# Patient Record
Sex: Male | Born: 1965 | Race: Black or African American | Hispanic: No | Marital: Married | Smoking: Never smoker
Health system: Southern US, Community
[De-identification: ages and names within clinical notes are randomized; demographics above are authoritative.]

## PROBLEM LIST (undated history)

## (undated) DIAGNOSIS — K219 Gastro-esophageal reflux disease without esophagitis: Secondary | ICD-10-CM

## (undated) DIAGNOSIS — C801 Malignant (primary) neoplasm, unspecified: Secondary | ICD-10-CM

## (undated) DIAGNOSIS — E119 Type 2 diabetes mellitus without complications: Secondary | ICD-10-CM

## (undated) DIAGNOSIS — I10 Essential (primary) hypertension: Secondary | ICD-10-CM

---

## 2013-12-06 HISTORY — PX: VASECTOMY: SHX75

## 2017-01-05 ENCOUNTER — Other Ambulatory Visit: Payer: Self-pay | Admitting: Urology

## 2017-01-05 DIAGNOSIS — C61 Malignant neoplasm of prostate: Secondary | ICD-10-CM

## 2017-01-24 ENCOUNTER — Ambulatory Visit
Admission: RE | Admit: 2017-01-24 | Discharge: 2017-01-24 | Disposition: A | Discharge status: Home / Self Care | Payer: BLUE CROSS/BLUE SHIELD | Source: Ambulatory Visit | Attending: Urology | Admitting: Urology

## 2017-01-24 DIAGNOSIS — C61 Malignant neoplasm of prostate: Secondary | ICD-10-CM

## 2017-01-24 MED ORDER — GADOBENATE DIMEGLUMINE 529 MG/ML IV SOLN
20.0000 mL | Freq: Once | INTRAVENOUS | Status: AC | PRN
  Administered 2017-01-24: 20 mL via INTRAVENOUS

## 2017-09-28 ENCOUNTER — Other Ambulatory Visit: Payer: Self-pay | Admitting: Urology

## 2017-10-20 NOTE — Patient Instructions (Addendum)
Ryan Newton  10/20/2017   Your procedure is scheduled on: 10-24-17   Report to [facility] Main  Entrance Take [place] Elevators to 3rd floor to  [place] at 5:15 AM.   Call this number if you have problems the morning of surgery [phone]    Remember: ONLY 1 PERSON MAY GO WITH YOU TO SHORT STAY TO GET  READY MORNING OF [name].  Do not eat food or drink liquids :After Midnight.     Take these medicines the morning of surgery with A SIP OF WATER: Carvedilol (Coreg), and Loratadine (Claritin) DO NOT TAKE ANY DIABETIC MEDICATIONS DAY OF YOUR SURGERY                               You may not have any metal on your body including hair pins and              piercings  Do not wear jewelry, lotions, powders, or deodorant             Men may shave face and neck.   Do not bring valuables to the hospital. [name].  Contacts, dentures or bridgework may not be worn into surgery.  Leave suitcase in the car. After surgery it may be brought to your room.                Please read over the following fact sheets you were given: _____________________________________________________________________ How to Manage Your Diabetes Before and After Surgery  Why is it important to control my blood sugar before and after surgery? . Improving blood sugar levels before and after surgery helps healing and can limit problems. . A way of improving blood sugar control is eating a healthy diet by: o  Eating less sugar and carbohydrates o  Increasing activity/exercise o  Talking with your doctor about reaching your blood sugar goals . High blood sugars (greater than 180 mg/dL) can raise your risk of infections and slow your recovery, so you will need to focus on controlling your diabetes during the weeks before surgery. . Make sure that the doctor who takes care of your diabetes knows about your planned surgery including  the date and location.  How do I manage my blood sugar before surgery? . Check your blood sugar at least 4 times a day, starting 2 days before surgery, to make sure that the level is not too high or low. o Check your blood sugar the morning of your surgery when you wake up and every 2 hours until you get to the Short Stay unit. . If your blood sugar is less than 70 mg/dL, you will need to treat for low blood sugar: o Do not take insulin. o Treat a low blood sugar (less than 70 mg/dL) with  cup of clear juice (cranberry or apple), 4 glucose tablets, OR glucose gel. o Recheck blood sugar in 15 minutes after treatment (to make sure it is greater than 70 mg/dL). If your blood sugar is not greater than 70 mg/dL on recheck, call [phone] for further instructions. . Report your blood sugar to the short stay nurse when you get to Short Stay.  . If you are admitted to the hospital after surgery: o  Your blood sugar will be checked by the staff and you will probably be given insulin after surgery (instead of oral diabetes medicines) to make sure you have good blood sugar levels. o The goal for blood sugar control after surgery is 80-180 mg/dL.   WHAT DO I DO ABOUT MY DIABETES MEDICATION?  Marland Kitchen Do not take oral diabetes medicines (pills) the morning of surgery.  . THE DAY BEFORE SURGERY, take your usual morning dose of Glimepiride (Amaryl), your usual  Pioglitazone-Metformin (Actosplus Met), and your usual 30 units of  Levemir  insulin.        Patient Signature:  Date:   Nurse Signature:  Date:   Reviewed and Endorsed by [facility] Patient Education Committee, August 2015            [facility] - Preparing for Surgery Before surgery, you can play an important role.  Because skin is not sterile, your skin needs to be as free of germs as possible.  You can reduce the number of germs on your skin by washing with CHG (chlorahexidine gluconate) soap before surgery.  CHG is an antiseptic cleaner  which kills germs and bonds with the skin to continue killing germs even after washing. Please DO NOT use if you have an allergy to CHG or antibacterial soaps.  If your skin becomes reddened/irritated stop using the CHG and inform your nurse when you arrive at Short Stay. Do not shave (including legs and underarms) for at least 48 hours prior to the first CHG shower.  You may shave your face/neck. Please follow these instructions carefully:  1.  Shower with CHG Soap the night before surgery and the  morning of Surgery.  2.  If you choose to wash your hair, wash your hair first as usual with your  normal  shampoo.  3.  After you shampoo, rinse your hair and body thoroughly to remove the  shampoo.                           4.  Use CHG as you would any other liquid soap.  You can apply chg directly  to the skin and wash                       Gently with a scrungie or clean washcloth.  5.  Apply the CHG Soap to your body ONLY FROM THE NECK DOWN.   Do not use on face/ open                           Wound or open sores. Avoid contact with eyes, ears mouth and genitals (private parts).                       Wash face,  Genitals (private parts) with your normal soap.             6.  Wash thoroughly, paying special attention to the area where your surgery  will be performed.  7.  Thoroughly rinse your body with warm water from the neck down.  8.  DO NOT shower/wash with your normal soap after using and rinsing off  the CHG Soap.                9.  Pat yourself dry with a clean towel.  10.  Wear clean pajamas.            11.  Place clean sheets on your bed the night of your first shower and do not  sleep with pets. Day of Surgery : Do not apply any lotions/deodorants the morning of surgery.  Please wear clean clothes to the hospital/surgery center.  FAILURE TO FOLLOW THESE INSTRUCTIONS MAY RESULT IN THE CANCELLATION OF YOUR SURGERY PATIENT SIGNATURE_________________________________  NURSE  SIGNATURE__________________________________  ________________________________________________________________________   Adam Phenix  An incentive spirometer is a tool that can help keep your lungs clear and active. This tool measures how well you are filling your lungs with each breath. Taking long deep breaths may help reverse or decrease the chance of developing breathing (pulmonary) problems (especially infection) following:  A long period of time when you are unable to move or be active. BEFORE THE PROCEDURE   If the spirometer includes an indicator to show your best effort, your nurse or respiratory therapist will set it to a desired goal.  If possible, sit up straight or lean slightly forward. Try not to slouch.  Hold the incentive spirometer in an upright position. INSTRUCTIONS FOR USE  1. Sit on the edge of your bed if possible, or sit up as far as you can in bed or on a chair. 2. Hold the incentive spirometer in an upright position. 3. Breathe out normally. 4. Place the mouthpiece in your mouth and seal your lips tightly around it. 5. Breathe in slowly and as deeply as possible, raising the piston or the ball toward the top of the column. 6. Hold your breath for 3-5 seconds or for as long as possible. Allow the piston or ball to fall to the bottom of the column. 7. Remove the mouthpiece from your mouth and breathe out normally. 8. Rest for a few seconds and repeat Steps 1 through 7 at least 10 times every 1-2 hours when you are awake. Take your time and take a few normal breaths between deep breaths. 9. The spirometer may include an indicator to show your best effort. Use the indicator as a goal to work toward during each repetition. 10. After each set of 10 deep breaths, practice coughing to be sure your lungs are clear. If you have an incision (the cut made at the time of surgery), support your incision when coughing by placing a pillow or rolled up towels firmly  against it. Once you are able to get out of bed, walk around indoors and cough well. You may stop using the incentive spirometer when instructed by your caregiver.  RISKS AND COMPLICATIONS  Take your time so you do not get dizzy or light-headed.  If you are in pain, you may need to take or ask for pain medication before doing incentive spirometry. It is harder to take a deep breath if you are having pain. AFTER USE  Rest and breathe slowly and easily.  It can be helpful to keep track of a log of your progress. Your caregiver can provide you with a simple table to help with this. If you are using the spirometer at home, follow these instructions: [place] IF:   You are having difficultly using the spirometer.  You have trouble using the spirometer as often as instructed.  Your pain medication is not giving enough relief while using the spirometer.  You develop fever of 100.5 F (38.1 C) or higher. SEEK IMMEDIATE MEDICAL CARE IF:   You cough up bloody  sputum that had not been present before.  You develop fever of 102 F (38.9 C) or greater.  You develop worsening pain at or near the incision site. MAKE SURE YOU:   Understand these instructions.  Will watch your condition.  Will get help right away if you are not doing well or get worse. Document Released: 04/04/2007 Document Revised: 02/14/2012 Document Reviewed: 06/05/2007 Exit[employer] Patient Information 2014 Exit[employer], [place].   ________________________________________________________________________  WHAT IS A BLOOD TRANSFUSION? Blood Transfusion Information  A transfusion is the replacement of blood or some of its parts. Blood is made up of multiple cells which provide different functions.  Red blood cells carry oxygen and are used for blood loss replacement.  White blood cells fight against infection.  Platelets control bleeding.  Plasma helps clot blood.  Other blood products are available for  specialized needs, such as hemophilia or other clotting disorders. BEFORE THE TRANSFUSION  Who gives blood for transfusions?   Healthy volunteers who are fully evaluated to make sure their blood is safe. This is blood bank blood. Transfusion therapy is the safest it has ever been in the practice of medicine. Before blood is taken from a donor, a complete history is taken to make sure that person has no history of diseases nor engages in risky social behavior (examples are intravenous drug use or sexual activity with multiple partners). The donor's travel history is screened to minimize risk of transmitting infections, such as malaria. The donated blood is tested for signs of infectious diseases, such as HIV and hepatitis. The blood is then tested to be sure it is compatible with you in order to minimize the chance of a transfusion reaction. If you or a relative donates blood, this is often done in anticipation of surgery and is not appropriate for emergency situations. It takes many days to process the donated blood. RISKS AND COMPLICATIONS Although transfusion therapy is very safe and saves many lives, the main dangers of transfusion include:   Getting an infectious disease.  Developing a transfusion reaction. This is an allergic reaction to something in the blood you were given. Every precaution is taken to prevent this. The decision to have a blood transfusion has been considered carefully by your caregiver before blood is given. Blood is not given unless the benefits outweigh the risks. AFTER THE TRANSFUSION  Right after receiving a blood transfusion, you will usually feel much better and more energetic. This is especially true if your red blood cells have gotten low (anemic). The transfusion raises the level of the red blood cells which carry oxygen, and this usually causes an energy increase.  The nurse administering the transfusion will monitor you carefully for complications. HOME CARE  INSTRUCTIONS  No special instructions are needed after a transfusion. You may find your energy is better. Speak with your caregiver about any limitations on activity for underlying diseases you may have. SEEK MEDICAL CARE IF:   Your condition is not improving after your transfusion.  You develop redness or irritation at the intravenous (IV) site. SEEK IMMEDIATE MEDICAL CARE IF:  Any of the following symptoms occur over the next 12 hours:  Shaking chills.  You have a temperature by mouth above 102 F (38.9 C), not controlled by medicine.  Chest, back, or muscle pain.  People around you feel you are not acting correctly or are confused.  Shortness of breath or difficulty breathing.  Dizziness and fainting.  You get a rash or develop hives.  You have a  decrease in urine output.  Your urine turns a dark color or changes to pink, red, or brown. Any of the following symptoms occur over the next 10 days:  You have a temperature by mouth above 102 F (38.9 C), not controlled by medicine.  Shortness of breath.  Weakness after normal activity.  The white part of the eye turns yellow (jaundice).  You have a decrease in the amount of urine or are urinating less often.  Your urine turns a dark color or changes to pink, red, or brown. Document Released: 11/19/2000 Document Revised: 02/14/2012 Document Reviewed: 07/08/2008 [facility] Patient Information 2014 [place], [place].  _______________________________________________________________________

## 2017-10-21 ENCOUNTER — Encounter (HOSPITAL_COMMUNITY)
Admission: RE | Admit: 2017-10-21 | Discharge: 2017-10-21 | Disposition: A | Discharge status: Home / Self Care | Payer: PRIVATE HEALTH INSURANCE | Source: Ambulatory Visit | Attending: Urology | Admitting: Urology

## 2017-10-21 ENCOUNTER — Other Ambulatory Visit: Payer: Self-pay

## 2017-10-21 ENCOUNTER — Encounter (HOSPITAL_COMMUNITY): Payer: Self-pay

## 2017-10-21 DIAGNOSIS — I10 Essential (primary) hypertension: Secondary | ICD-10-CM | POA: Diagnosis not present

## 2017-10-21 DIAGNOSIS — E7800 Pure hypercholesterolemia, unspecified: Secondary | ICD-10-CM | POA: Diagnosis not present

## 2017-10-21 DIAGNOSIS — C61 Malignant neoplasm of prostate: Secondary | ICD-10-CM | POA: Diagnosis not present

## 2017-10-21 DIAGNOSIS — Z880 Allergy status to penicillin: Secondary | ICD-10-CM | POA: Diagnosis not present

## 2017-10-21 DIAGNOSIS — Z794 Long term (current) use of insulin: Secondary | ICD-10-CM | POA: Diagnosis not present

## 2017-10-21 DIAGNOSIS — E119 Type 2 diabetes mellitus without complications: Secondary | ICD-10-CM | POA: Diagnosis not present

## 2017-10-21 DIAGNOSIS — Z79899 Other long term (current) drug therapy: Secondary | ICD-10-CM | POA: Diagnosis not present

## 2017-10-21 HISTORY — DX: Gastro-esophageal reflux disease without esophagitis: K21.9

## 2017-10-21 HISTORY — DX: Essential (primary) hypertension: I10

## 2017-10-21 HISTORY — DX: Malignant (primary) neoplasm, unspecified: C80.1

## 2017-10-21 HISTORY — DX: Type 2 diabetes mellitus without complications: E11.9

## 2017-10-21 LAB — HEMOGLOBIN A1C
HEMOGLOBIN A1C: 10.7 % — AB (ref 4.8–5.6)
Mean Plasma Glucose: 260.39 mg/dL

## 2017-10-21 LAB — CBC
HCT: 41 % (ref 39.0–52.0)
HEMOGLOBIN: 13.8 g/dL (ref 13.0–17.0)
MCH: 26.2 pg (ref 26.0–34.0)
MCHC: 33.7 g/dL (ref 30.0–36.0)
MCV: 77.8 fL — ABNORMAL LOW (ref 78.0–100.0)
Platelets: 233 10*3/uL (ref 150–400)
RBC: 5.27 MIL/uL (ref 4.22–5.81)
RDW: 13.6 % (ref 11.5–15.5)
WBC: 4.5 10*3/uL (ref 4.0–10.5)

## 2017-10-21 LAB — BASIC METABOLIC PANEL
ANION GAP: 9 (ref 5–15)
BUN: 16 mg/dL (ref 6–20)
CALCIUM: 9.1 mg/dL (ref 8.9–10.3)
CHLORIDE: 101 mmol/L (ref 101–111)
CO2: 27 mmol/L (ref 22–32)
Creatinine, Ser: 1.1 mg/dL (ref 0.61–1.24)
GFR calc non Af Amer: >60 mL/min (ref >60)
Glucose, Bld: 307 mg/dL — ABNORMAL HIGH (ref 65–99)
Potassium: 4.5 mmol/L (ref 3.5–5.1)
SODIUM: 137 mmol/L (ref 135–145)

## 2017-10-21 LAB — GLUCOSE, CAPILLARY: Glucose-Capillary: 330 mg/dL — ABNORMAL HIGH (ref 65–99)

## 2017-10-21 LAB — ABO/RH: ABO/RH(D): A POS

## 2017-10-21 NOTE — Progress Notes (Addendum)
10-21-17 HGA1C result routed to Dr. Alinda Money. Also called Beola Cord at [facility] regarding pt's HGA1C and blood glucose results. She will forward to Dr. Alinda Money for review.

## 2017-10-21 NOTE — Progress Notes (Signed)
   10/21/17 0838  OBSTRUCTIVE SLEEP APNEA  Have you ever been diagnosed with sleep apnea through a sleep study? No  Do you snore loudly (loud enough to be heard through closed doors)?  1  Do you often feel tired, fatigued, or sleepy during the daytime (such as falling asleep during driving or talking to someone)? 0  Has anyone observed you stop breathing during your sleep? 0  Do you have, or are you being treated for high blood pressure? 1  BMI more than 35 kg/m2? 1  Age > 50 (1-yes) 1  Neck circumference greater than:Male 16 inches or larger, Male 17inches or larger? 0  Male Gender (Yes=1) 1  Obstructive Sleep Apnea Score 5

## 2017-10-23 NOTE — Anesthesia Preprocedure Evaluation (Addendum)
Anesthesia Evaluation  Patient identified by MRN, date of birth, ID band Patient awake    Reviewed: Allergy & Precautions, H&P , Patient's Chart, lab work & pertinent test results, reviewed documented beta blocker date and time   Airway Mallampati: II  TM Distance: >3 FB Neck ROM: full    Dental no notable dental hx.    Pulmonary    Pulmonary exam normal breath sounds clear to auscultation       Cardiovascular hypertension,  Rhythm:regular Rate:Normal     Neuro/Psych    GI/Hepatic   Endo/Other  diabetes  Renal/GU      Musculoskeletal   Abdominal   Peds  Hematology   Anesthesia Other Findings   Reproductive/Obstetrics                             Anesthesia Physical Anesthesia Plan  ASA: III  Anesthesia Plan: General   Post-op Pain Management:    Induction: Intravenous  PONV Risk Score and Plan: 2 and Treatment may vary due to age or medical condition, Ondansetron, Scopolamine patch - Pre-op and Dexamethasone  Airway Management Planned: Oral ETT  Additional Equipment:   Intra-op Plan:   Post-operative Plan: Extubation in OR  Informed Consent: I have reviewed the patients History and Physical, chart, labs and discussed the procedure including the risks, benefits and alternatives for the proposed anesthesia with the patient or authorized representative who has indicated his/her understanding and acceptance.   Dental Advisory Given  Plan Discussed with: CRNA and Surgeon  Anesthesia Plan Comments: ( Am BS; May need glucomander )       Anesthesia Quick Evaluation

## 2017-10-24 ENCOUNTER — Other Ambulatory Visit: Payer: Self-pay

## 2017-10-24 ENCOUNTER — Ambulatory Visit (HOSPITAL_COMMUNITY): Payer: PRIVATE HEALTH INSURANCE | Admitting: Anesthesiology

## 2017-10-24 ENCOUNTER — Encounter (HOSPITAL_COMMUNITY)
Admission: RE | Disposition: A | Discharge status: Home / Self Care | Payer: Self-pay | Source: Ambulatory Visit | Attending: Urology

## 2017-10-24 ENCOUNTER — Observation Stay (HOSPITAL_COMMUNITY)
Admission: RE | Admit: 2017-10-24 | Discharge: 2017-10-25 | Disposition: A | Discharge status: Home / Self Care | Payer: PRIVATE HEALTH INSURANCE | Source: Ambulatory Visit | Attending: Urology | Admitting: Urology

## 2017-10-24 ENCOUNTER — Encounter (HOSPITAL_COMMUNITY): Payer: Self-pay | Admitting: *Deleted

## 2017-10-24 DIAGNOSIS — Z880 Allergy status to penicillin: Secondary | ICD-10-CM | POA: Insufficient documentation

## 2017-10-24 DIAGNOSIS — I10 Essential (primary) hypertension: Secondary | ICD-10-CM | POA: Insufficient documentation

## 2017-10-24 DIAGNOSIS — C61 Malignant neoplasm of prostate: Principal | ICD-10-CM | POA: Diagnosis present

## 2017-10-24 DIAGNOSIS — E119 Type 2 diabetes mellitus without complications: Secondary | ICD-10-CM | POA: Insufficient documentation

## 2017-10-24 DIAGNOSIS — Z794 Long term (current) use of insulin: Secondary | ICD-10-CM | POA: Insufficient documentation

## 2017-10-24 DIAGNOSIS — E7800 Pure hypercholesterolemia, unspecified: Secondary | ICD-10-CM | POA: Insufficient documentation

## 2017-10-24 DIAGNOSIS — Z79899 Other long term (current) drug therapy: Secondary | ICD-10-CM | POA: Insufficient documentation

## 2017-10-24 HISTORY — PX: LYMPHADENECTOMY: SHX5960

## 2017-10-24 HISTORY — PX: ROBOT ASSISTED LAPAROSCOPIC RADICAL PROSTATECTOMY: SHX5141

## 2017-10-24 LAB — TYPE AND SCREEN
ABO/RH(D): A POS
Antibody Screen: NEGATIVE

## 2017-10-24 LAB — HEMOGLOBIN AND HEMATOCRIT, BLOOD
HEMATOCRIT: 39 % (ref 39.0–52.0)
HEMOGLOBIN: 12.8 g/dL — AB (ref 13.0–17.0)

## 2017-10-24 LAB — GLUCOSE, CAPILLARY
GLUCOSE-CAPILLARY: 238 mg/dL — AB (ref 65–99)
GLUCOSE-CAPILLARY: 250 mg/dL — AB (ref 65–99)
GLUCOSE-CAPILLARY: 329 mg/dL — AB (ref 65–99)
Glucose-Capillary: 170 mg/dL — ABNORMAL HIGH (ref 65–99)
Glucose-Capillary: 263 mg/dL — ABNORMAL HIGH (ref 65–99)

## 2017-10-24 SURGERY — ROBOTIC ASSISTED LAPAROSCOPIC RADICAL PROSTATECTOMY LEVEL 2
Anesthesia: General

## 2017-10-24 MED ORDER — DIPHENHYDRAMINE HCL 50 MG/ML IJ SOLN: 12.5000 mg | Freq: Four times a day (QID) | INTRAMUSCULAR | Status: DC | PRN

## 2017-10-24 MED ORDER — MORPHINE SULFATE (PF) 4 MG/ML IV SOLN
2.0000 mg | INTRAVENOUS | Status: DC | PRN
  Administered 2017-10-24: 4 mg via INTRAVENOUS
  Administered 2017-10-24: 2 mg via INTRAVENOUS
  Filled 2017-10-24 (×2): qty 1

## 2017-10-24 MED ORDER — PROPOFOL 10 MG/ML IV BOLUS
INTRAVENOUS | Status: DC | PRN
  Administered 2017-10-24: 200 mg via INTRAVENOUS

## 2017-10-24 MED ORDER — LIDOCAINE 2% (20 MG/ML) 5 ML SYRINGE
INTRAMUSCULAR | Status: AC
  Filled 2017-10-24: qty 5

## 2017-10-24 MED ORDER — HEPARIN SODIUM (PORCINE) 1000 UNIT/ML IJ SOLN
INTRAMUSCULAR | Status: AC
  Filled 2017-10-24: qty 1

## 2017-10-24 MED ORDER — SODIUM CHLORIDE 0.9 % IV BOLUS (SEPSIS)
1000.0000 mL | Freq: Once | INTRAVENOUS | Status: AC
  Administered 2017-10-24: 1000 mL via INTRAVENOUS

## 2017-10-24 MED ORDER — SCOPOLAMINE 1 MG/3DAYS TD PT72
MEDICATED_PATCH | TRANSDERMAL | Status: AC
  Filled 2017-10-24: qty 1

## 2017-10-24 MED ORDER — PRAVASTATIN SODIUM 20 MG PO TABS
20.0000 mg | ORAL_TABLET | Freq: Every evening | ORAL | Status: DC
  Administered 2017-10-24: 20 mg via ORAL
  Filled 2017-10-24: qty 1

## 2017-10-24 MED ORDER — DOCUSATE SODIUM 100 MG PO CAPS
100.0000 mg | ORAL_CAPSULE | Freq: Two times a day (BID) | ORAL | Status: DC
  Administered 2017-10-24 – 2017-10-25 (×2): 100 mg via ORAL
  Filled 2017-10-24 (×2): qty 1

## 2017-10-24 MED ORDER — SUGAMMADEX SODIUM 500 MG/5ML IV SOLN
INTRAVENOUS | Status: DC | PRN
  Administered 2017-10-24: 250 mg via INTRAVENOUS

## 2017-10-24 MED ORDER — KETOROLAC TROMETHAMINE 15 MG/ML IJ SOLN
15.0000 mg | Freq: Four times a day (QID) | INTRAMUSCULAR | Status: DC
  Administered 2017-10-24 – 2017-10-25 (×5): 15 mg via INTRAVENOUS
  Filled 2017-10-24 (×4): qty 1

## 2017-10-24 MED ORDER — BUPIVACAINE-EPINEPHRINE (PF) 0.25% -1:200000 IJ SOLN
INTRAMUSCULAR | Status: AC
  Filled 2017-10-24: qty 30

## 2017-10-24 MED ORDER — HYDROMORPHONE HCL 1 MG/ML IJ SOLN
INTRAMUSCULAR | Status: AC
  Administered 2017-10-24: 0.5 mg via INTRAVENOUS
  Filled 2017-10-24: qty 2

## 2017-10-24 MED ORDER — LIDOCAINE HCL (CARDIAC) 20 MG/ML IV SOLN
INTRAVENOUS | Status: DC | PRN
  Administered 2017-10-24: 100 mg via INTRAVENOUS
  Administered 2017-10-24: 50 mg via INTRAVENOUS

## 2017-10-24 MED ORDER — LACTATED RINGERS IV SOLN
INTRAVENOUS | Status: DC | PRN
  Administered 2017-10-24 (×2): via INTRAVENOUS

## 2017-10-24 MED ORDER — PROPOFOL 10 MG/ML IV BOLUS
INTRAVENOUS | Status: AC
  Filled 2017-10-24: qty 20

## 2017-10-24 MED ORDER — ACETAMINOPHEN 325 MG PO TABS: 650.0000 mg | ORAL_TABLET | ORAL | Status: DC | PRN

## 2017-10-24 MED ORDER — SCOPOLAMINE 1 MG/3DAYS TD PT72
MEDICATED_PATCH | TRANSDERMAL | Status: DC | PRN
  Administered 2017-10-24: 1 via TRANSDERMAL

## 2017-10-24 MED ORDER — FLEET ENEMA 7-19 GM/118ML RE ENEM: 1.0000 | ENEMA | Freq: Once | RECTAL | Status: DC

## 2017-10-24 MED ORDER — BUPIVACAINE-EPINEPHRINE 0.25% -1:200000 IJ SOLN
INTRAMUSCULAR | Status: DC | PRN
  Administered 2017-10-24: 30 mL

## 2017-10-24 MED ORDER — HYDROCODONE-ACETAMINOPHEN 5-325 MG PO TABS: 1.0000 | ORAL_TABLET | Freq: Four times a day (QID) | ORAL | 0 refills | Status: AC | PRN

## 2017-10-24 MED ORDER — MIDAZOLAM HCL 5 MG/5ML IJ SOLN
INTRAMUSCULAR | Status: DC | PRN
  Administered 2017-10-24: 2 mg via INTRAVENOUS

## 2017-10-24 MED ORDER — LORATADINE 10 MG PO TABS: 10.0000 mg | ORAL_TABLET | Freq: Every day | ORAL | Status: DC | PRN

## 2017-10-24 MED ORDER — LACTATED RINGERS IV SOLN
INTRAVENOUS | Status: DC | PRN
  Administered 2017-10-24: 1000 mL

## 2017-10-24 MED ORDER — KETOROLAC TROMETHAMINE 15 MG/ML IJ SOLN
INTRAMUSCULAR | Status: AC
  Filled 2017-10-24: qty 1

## 2017-10-24 MED ORDER — SULFAMETHOXAZOLE-TRIMETHOPRIM 800-160 MG PO TABS: 1.0000 | ORAL_TABLET | Freq: Two times a day (BID) | ORAL | 0 refills | Status: AC

## 2017-10-24 MED ORDER — ROCURONIUM BROMIDE 100 MG/10ML IV SOLN
INTRAVENOUS | Status: DC | PRN
  Administered 2017-10-24: 10 mg via INTRAVENOUS
  Administered 2017-10-24: 50 mg via INTRAVENOUS
  Administered 2017-10-24 (×2): 20 mg via INTRAVENOUS

## 2017-10-24 MED ORDER — DIPHENHYDRAMINE HCL 12.5 MG/5ML PO ELIX: 12.5000 mg | ORAL_SOLUTION | Freq: Four times a day (QID) | ORAL | Status: DC | PRN

## 2017-10-24 MED ORDER — FENTANYL CITRATE (PF) 250 MCG/5ML IJ SOLN
INTRAMUSCULAR | Status: AC
  Filled 2017-10-24: qty 5

## 2017-10-24 MED ORDER — CEFAZOLIN SODIUM-DEXTROSE 1-4 GM/50ML-% IV SOLN
1.0000 g | Freq: Three times a day (TID) | INTRAVENOUS | Status: AC
  Administered 2017-10-24 – 2017-10-25 (×2): 1 g via INTRAVENOUS
  Filled 2017-10-24 (×2): qty 50

## 2017-10-24 MED ORDER — INSULIN ASPART 100 UNIT/ML ~~LOC~~ SOLN
5.0000 [IU] | Freq: Once | SUBCUTANEOUS | Status: AC
  Administered 2017-10-24: 5 [IU] via SUBCUTANEOUS

## 2017-10-24 MED ORDER — DEXAMETHASONE SODIUM PHOSPHATE 10 MG/ML IJ SOLN
INTRAMUSCULAR | Status: DC | PRN
  Administered 2017-10-24: 10 mg via INTRAVENOUS

## 2017-10-24 MED ORDER — FENTANYL CITRATE (PF) 100 MCG/2ML IJ SOLN
INTRAMUSCULAR | Status: AC
  Filled 2017-10-24: qty 2

## 2017-10-24 MED ORDER — INSULIN ASPART 100 UNIT/ML ~~LOC~~ SOLN
0.0000 [IU] | SUBCUTANEOUS | Status: DC
  Administered 2017-10-24: 5 [IU] via SUBCUTANEOUS
  Administered 2017-10-24: 8 [IU] via SUBCUTANEOUS
  Administered 2017-10-24: 11 [IU] via SUBCUTANEOUS
  Administered 2017-10-25 (×2): 3 [IU] via SUBCUTANEOUS
  Administered 2017-10-25: 5 [IU] via SUBCUTANEOUS
  Administered 2017-10-25: 3 [IU] via SUBCUTANEOUS

## 2017-10-24 MED ORDER — ROCURONIUM BROMIDE 50 MG/5ML IV SOSY
PREFILLED_SYRINGE | INTRAVENOUS | Status: AC
  Filled 2017-10-24: qty 10

## 2017-10-24 MED ORDER — MAGNESIUM CITRATE PO SOLN
1.0000 | Freq: Once | ORAL | Status: DC
  Filled 2017-10-24: qty 296

## 2017-10-24 MED ORDER — CARVEDILOL 12.5 MG PO TABS
12.5000 mg | ORAL_TABLET | Freq: Two times a day (BID) | ORAL | Status: DC
  Administered 2017-10-24 – 2017-10-25 (×2): 12.5 mg via ORAL
  Filled 2017-10-24 (×2): qty 1

## 2017-10-24 MED ORDER — CEFAZOLIN SODIUM-DEXTROSE 2-4 GM/100ML-% IV SOLN
INTRAVENOUS | Status: AC
  Filled 2017-10-24: qty 100

## 2017-10-24 MED ORDER — MIDAZOLAM HCL 2 MG/2ML IJ SOLN
INTRAMUSCULAR | Status: AC
  Filled 2017-10-24: qty 2

## 2017-10-24 MED ORDER — ONDANSETRON HCL 4 MG/2ML IJ SOLN
INTRAMUSCULAR | Status: AC
  Filled 2017-10-24: qty 2

## 2017-10-24 MED ORDER — POTASSIUM CHLORIDE IN NACL 20-0.45 MEQ/L-% IV SOLN
INTRAVENOUS | Status: DC
  Administered 2017-10-24 – 2017-10-25 (×3): via INTRAVENOUS
  Filled 2017-10-24 (×4): qty 1000

## 2017-10-24 MED ORDER — ONDANSETRON HCL 4 MG/2ML IJ SOLN
INTRAMUSCULAR | Status: DC | PRN
  Administered 2017-10-24: 4 mg via INTRAVENOUS

## 2017-10-24 MED ORDER — SUGAMMADEX SODIUM 500 MG/5ML IV SOLN
INTRAVENOUS | Status: AC
  Filled 2017-10-24: qty 5

## 2017-10-24 MED ORDER — INSULIN ASPART 100 UNIT/ML ~~LOC~~ SOLN
SUBCUTANEOUS | Status: AC
  Filled 2017-10-24: qty 1

## 2017-10-24 MED ORDER — HYDROMORPHONE HCL 1 MG/ML IJ SOLN
0.2500 mg | INTRAMUSCULAR | Status: DC | PRN
  Administered 2017-10-24 (×3): 0.5 mg via INTRAVENOUS

## 2017-10-24 MED ORDER — FENTANYL CITRATE (PF) 100 MCG/2ML IJ SOLN
INTRAMUSCULAR | Status: DC | PRN
  Administered 2017-10-24 (×2): 100 ug via INTRAVENOUS
  Administered 2017-10-24: 50 ug via INTRAVENOUS
  Administered 2017-10-24: 25 ug via INTRAVENOUS
  Administered 2017-10-24: 50 ug via INTRAVENOUS

## 2017-10-24 MED ORDER — BUPIVACAINE-EPINEPHRINE (PF) 0.5% -1:200000 IJ SOLN
INTRAMUSCULAR | Status: AC
  Filled 2017-10-24: qty 30

## 2017-10-24 MED ORDER — CEFAZOLIN SODIUM-DEXTROSE 2-4 GM/100ML-% IV SOLN
2.0000 g | Freq: Once | INTRAVENOUS | Status: AC
  Administered 2017-10-24: 2 g via INTRAVENOUS

## 2017-10-24 MED ORDER — SODIUM CHLORIDE 0.9 % IR SOLN
Status: DC | PRN
  Administered 2017-10-24: 1000 mL via INTRAVESICAL

## 2017-10-24 SURGICAL SUPPLY — 53 items
APPLICATOR COTTON TIP 6IN STRL (MISCELLANEOUS) ×4 IMPLANT
CATH FOLEY 2WAY SLVR 18FR 30CC (CATHETERS) ×4 IMPLANT
CATH ROBINSON RED A/P 16FR (CATHETERS) ×4 IMPLANT
CATH ROBINSON RED A/P 8FR (CATHETERS) ×4 IMPLANT
CATH TIEMANN FOLEY 18FR 5CC (CATHETERS) ×4 IMPLANT
CHLORAPREP W/TINT 26ML (MISCELLANEOUS) ×4 IMPLANT
CLIP VESOLOCK LG 6/CT PURPLE (CLIP) ×8 IMPLANT
COVER SURGICAL LIGHT HANDLE (MISCELLANEOUS) ×4 IMPLANT
COVER TIP SHEARS 8 DVNC (MISCELLANEOUS) ×2 IMPLANT
COVER TIP SHEARS 8MM DA VINCI (MISCELLANEOUS) ×2
CUTTER ECHEON FLEX ENDO 45 340 (ENDOMECHANICALS) ×4 IMPLANT
DECANTER SPIKE VIAL GLASS SM (MISCELLANEOUS) IMPLANT
DERMABOND ADVANCED (GAUZE/BANDAGES/DRESSINGS) ×2
DERMABOND ADVANCED .7 DNX12 (GAUZE/BANDAGES/DRESSINGS) ×2 IMPLANT
DRAPE ARM DVNC X/XI (DISPOSABLE) ×8 IMPLANT
DRAPE COLUMN DVNC XI (DISPOSABLE) ×2 IMPLANT
DRAPE DA VINCI XI ARM (DISPOSABLE) ×8
DRAPE DA VINCI XI COLUMN (DISPOSABLE) ×2
DRAPE SURG IRRIG POUCH 19X23 (DRAPES) ×4 IMPLANT
DRSG TEGADERM 4X4.75 (GAUZE/BANDAGES/DRESSINGS) ×4 IMPLANT
ELECT REM PT RETURN 15FT ADLT (MISCELLANEOUS) ×4 IMPLANT
GLOVE BIO SURGEON STRL SZ 6.5 (GLOVE) ×3 IMPLANT
GLOVE BIO SURGEONS STRL SZ 6.5 (GLOVE) ×1
GLOVE BIOGEL M STRL SZ7.5 (GLOVE) ×8 IMPLANT
GOWN STRL REUS W/TWL LRG LVL3 (GOWN DISPOSABLE) ×12 IMPLANT
HOLDER FOLEY CATH W/STRAP (MISCELLANEOUS) ×4 IMPLANT
IRRIG SUCT STRYKERFLOW 2 WTIP (MISCELLANEOUS) ×4
IRRIGATION SUCT STRKRFLW 2 WTP (MISCELLANEOUS) ×2 IMPLANT
IV LACTATED RINGERS 1000ML (IV SOLUTION) ×4 IMPLANT
NDL SAFETY ECLIPSE 18X1.5 (NEEDLE) ×2 IMPLANT
NEEDLE HYPO 18GX1.5 SHARP (NEEDLE) ×2
PACK ROBOT UROLOGY CUSTOM (CUSTOM PROCEDURE TRAY) ×4 IMPLANT
SEAL CANN UNIV 5-8 DVNC XI (MISCELLANEOUS) ×8 IMPLANT
SEAL XI 5MM-8MM UNIVERSAL (MISCELLANEOUS) ×8
SOLUTION ELECTROLUBE (MISCELLANEOUS) ×4 IMPLANT
STAPLE RELOAD 45 GRN (STAPLE) ×2 IMPLANT
STAPLE RELOAD 45MM GREEN (STAPLE) ×2
SUT ETHILON 3 0 PS 1 (SUTURE) ×4 IMPLANT
SUT MNCRL 3 0 RB1 (SUTURE) ×2 IMPLANT
SUT MNCRL 3 0 VIOLET RB1 (SUTURE) ×2 IMPLANT
SUT MNCRL AB 4-0 PS2 18 (SUTURE) ×8 IMPLANT
SUT MONOCRYL 3 0 RB1 (SUTURE) ×4
SUT VIC AB 0 CT1 27 (SUTURE) ×2
SUT VIC AB 0 CT1 27XBRD ANTBC (SUTURE) ×2 IMPLANT
SUT VIC AB 0 UR5 27 (SUTURE) ×4 IMPLANT
SUT VIC AB 2-0 SH 27 (SUTURE) ×4
SUT VIC AB 2-0 SH 27X BRD (SUTURE) ×4 IMPLANT
SUT VICRYL 0 UR6 27IN ABS (SUTURE) ×8 IMPLANT
SYR 27GX1/2 1ML LL SAFETY (SYRINGE) ×4 IMPLANT
TOWEL OR 17X26 10 PK STRL BLUE (TOWEL DISPOSABLE) ×4 IMPLANT
TOWEL OR NON WOVEN STRL DISP B (DISPOSABLE) ×4 IMPLANT
TUBING INSUFFLATION 10FT LAP (TUBING) IMPLANT
WATER STERILE IRR 1000ML POUR (IV SOLUTION) ×8 IMPLANT

## 2017-10-24 NOTE — Anesthesia Procedure Notes (Signed)
Procedure Name: Intubation Date/Time: 10/24/2017 7:24 AM Performed by: Glory Buff, CRNA Pre-anesthesia Checklist: Patient identified, Emergency Drugs available, Suction available and Patient being monitored Patient Re-evaluated:Patient Re-evaluated prior to induction Oxygen Delivery Method: Circle system utilized Preoxygenation: Pre-oxygenation with 100% oxygen Induction Type: IV induction Ventilation: Mask ventilation without difficulty Laryngoscope Size: Miller and 3 Grade View: Grade I Tube type: Oral Tube size: 7.5 mm Number of attempts: 1 Airway Equipment and Method: Stylet and Oral airway Placement Confirmation: ETT inserted through vocal cords under direct vision,  positive ETCO2 and breath sounds checked- equal and bilateral Secured at: 21 cm Tube secured with: Tape Dental Injury: Teeth and Oropharynx as per pre-operative assessment

## 2017-10-24 NOTE — Discharge Instructions (Signed)

## 2017-10-24 NOTE — Op Note (Signed)
Preoperative diagnosis: Clinically localized adenocarcinoma of the prostate (clinical stage T1c Nx Mx)  Postoperative diagnosis: Clinically localized adenocarcinoma of the prostate (clinical stage T1c Nx Mx)  Procedure:  1. Robotic assisted laparoscopic radical prostatectomy (bilateral nerve sparing) 2. Bilateral robotic assisted laparoscopic pelvic lymphadenectomy  Surgeon: Pryor Curia. M.D.  Assistant: Debbrah Alar, PA-C  An assistant was required for this surgical procedure.  The duties of the assistant included but were not limited to suctioning, passing suture, camera manipulation, retraction. This procedure would not be able to be performed without an Environmental consultant.  Anesthesia: General  Complications: None  EBL: 100 mL  IVF:  1700 mL crystalloid  Specimens: 1. Prostate and seminal vesicles 2. Right pelvic lymph nodes 3. Left pelvic lymph nodes  Disposition of specimens: Pathology  Drains: 1. 20 Fr coude catheter 2. # 19 Blake pelvic drain  Indication: Ryan Newton is a 51 y.o. year old patient with clinically localized prostate cancer.  After a thorough review of the management options for treatment of prostate cancer, he elected to proceed with surgical therapy and the above procedure(s).  We have discussed the potential benefits and risks of the procedure, side effects of the proposed treatment, the likelihood of the patient achieving the goals of the procedure, and any potential problems that might occur during the procedure or recuperation. Informed consent has been obtained.  Description of procedure:  The patient was taken to the operating room and a general anesthetic was administered. He was given preoperative antibiotics, placed in the dorsal lithotomy position, and prepped and draped in the usual sterile fashion. Next a preoperative timeout was performed. A urethral catheter was placed into the bladder and a site was selected near the umbilicus for  placement of the camera port. This was placed using a standard open Hassan technique which allowed entry into the peritoneal cavity under direct vision and without difficulty. An 8 mm robotic port was placed and a pneumoperitoneum established. The camera was then used to inspect the abdomen and there was no evidence of any intra-abdominal injuries or other abnormalities. The remaining abdominal ports were then placed. 8 mm robotic ports were placed in the right lower quadrant, left lower quadrant, and far left lateral abdominal wall. A 5 mm port was placed in the right upper quadrant and a 12 mm port was placed in the right lateral abdominal wall for laparoscopic assistance. All ports were placed under direct vision without difficulty. The surgical cart was then docked.   Utilizing the cautery scissors, the bladder was reflected posteriorly allowing entry into the space of Retzius and identification of the endopelvic fascia and prostate. The periprostatic fat was then removed from the prostate allowing full exposure of the endopelvic fascia. The endopelvic fascia was then incised from the apex back to the base of the prostate bilaterally and the underlying levator muscle fibers were swept laterally off the prostate thereby isolating the dorsal venous complex. The dorsal vein was then stapled and divided with a 45 mm Flex Echelon stapler. Attention then turned to the bladder neck which was divided anteriorly thereby allowing entry into the bladder and exposure of the urethral catheter. The catheter balloon was deflated and the catheter was brought into the operative field and used to retract the prostate anteriorly. The posterior bladder neck was then examined and was divided allowing further dissection between the bladder and prostate posteriorly until the vasa deferentia and seminal vessels were identified. The vasa deferentia were isolated, divided, and lifted anteriorly. The  seminal vesicles were dissected down  to their tips with care to control the seminal vascular arterial blood supply. These structures were then lifted anteriorly and the space between Denonvillier's fascia and the anterior rectum was developed with a combination of sharp and blunt dissection. This isolated the vascular pedicles of the prostate.  The lateral prostatic fascia was then sharply incised allowing release of the neurovascular bundles bilaterally. The vascular pedicles of the prostate were then ligated with Weck clips between the prostate and neurovascular bundles and divided with sharp cold scissor dissection resulting in neurovascular bundle preservation. The neurovascular bundles were then separated off the apex of the prostate and urethra bilaterally.  The urethra was then sharply transected allowing the prostate specimen to be disarticulated. The pelvis was copiously irrigated and hemostasis was ensured. There was no evidence for rectal injury.  Attention then turned to the right pelvic sidewall. The fibrofatty tissue between the external iliac vein, confluence of the iliac vessels, hypogastric artery, and Cooper's ligament was dissected free from the pelvic sidewall with care to preserve the obturator nerve. Weck clips were used for lymphostasis and hemostasis. An identical procedure was performed on the contralateral side and the lymphatic packets were removed for permanent pathologic analysis.  Attention then turned to the urethral anastomosis. A 2-0 Vicryl slip knot was placed between Denonvillier's fascia, the posterior bladder neck, and the posterior urethra to reapproximate these structures. A double-armed 3-0 Monocryl suture was then used to perform a 360 running tension-free anastomosis between the bladder neck and urethra. A new urethral catheter was then placed into the bladder and irrigated. There were no blood clots within the bladder and the anastomosis appeared to be watertight. A #19 Blake drain was then brought  through the left lateral 8 mm port site and positioned appropriately within the pelvis. It was secured to the skin with a nylon suture. The surgical cart was then undocked. The right lateral 12 mm port site was closed at the fascial level with a 0 Vicryl suture placed laparoscopically. All remaining ports were then removed under direct vision. The prostate specimen was removed intact within the Endopouch retrieval bag via the periumbilical camera port site. This fascial opening was closed with two running 0 Vicryl sutures. 0.25% Marcaine was then injected into all port sites and all incisions were reapproximated at the skin level with 4-0 Monocryl subcuticular sutures and Liquiband. The patient appeared to tolerate the procedure well and without complications. The patient was able to be extubated and transferred to the recovery unit in satisfactory condition.   Pryor Curia MD

## 2017-10-24 NOTE — Progress Notes (Signed)
Patient ID: Ryan Newton, male   DOB: [birth year], 51 y.o.   MRN: [id number]  Post-op note  Subjective: The patient is doing well.  No complaints.  Objective: Vital signs in last 24 hours: Temp:  [97.6 F (36.4 C)-98.6 F (37 C)] 97.7 F (36.5 C) (11/19 1200) Pulse Rate:  [69-90] 78 (11/19 1200) Resp:  [15-20] 17 (11/19 1200) BP: (139-156)/(83-104) 156/99 (11/19 1200) SpO2:  [99 %-100 %] 100 % (11/19 1200) Weight:  [113.9 kg (251 lb)] 113.9 kg (251 lb) (11/19 0545)  Intake/Output from previous day: No intake/output data recorded. Intake/Output this shift: No intake/output data recorded.  Physical Exam:  General: Alert and oriented. Abdomen: Soft, Nondistended. Incisions: Clean and dry.  Lab Results: Recent Labs    10/24/17 1146  HGB 12.8*  HCT 39.0    Assessment/Plan: POD#0   1) Continue to monitor, ambulate, IS   Pryor Curia. MD   LOS: 0 days   Wiktoria Hemrick,LES 10/24/2017, 7:37 PM

## 2017-10-24 NOTE — Transfer of Care (Signed)
Immediate Anesthesia Transfer of Care Note  Patient: Ryan Newton  Procedure(s) Performed: ROBOTIC ASSISTED LAPAROSCOPIC RADICAL PROSTATECTOMY LEVEL 2 (N/A ) LYMPHADENECTOMY, PELVIC (Bilateral )  Patient Location: PACU  Anesthesia Type:General  Level of Consciousness: awake, alert  and oriented  Airway & Oxygen Therapy: Patient Spontanous Breathing and Patient connected to face mask oxygen  Post-op Assessment: Report given to RN and Post -op Vital signs reviewed and stable  Post vital signs: Reviewed and stable  Last Vitals:  Vitals:   10/24/17 0545  BP: (!) 146/92  Pulse: 90  Resp: 20  Temp: 36.4 C  SpO2: 100%    Last Pain:  Vitals:   10/24/17 0545  TempSrc: Oral      Patients Stated Pain Goal: 3 (07/07/22 3612)  Complications: No apparent anesthesia complications

## 2017-10-24 NOTE — Anesthesia Postprocedure Evaluation (Signed)
Anesthesia Post Note  Patient: Ryan Newton  Procedure(s) Performed: ROBOTIC ASSISTED LAPAROSCOPIC RADICAL PROSTATECTOMY LEVEL 2 (N/A ) LYMPHADENECTOMY, PELVIC (Bilateral )     Patient location during evaluation: PACU Anesthesia Type: General Level of consciousness: awake and alert Pain management: pain level controlled Vital Signs Assessment: post-procedure vital signs reviewed and stable Respiratory status: spontaneous breathing, nonlabored ventilation, respiratory function stable and patient connected to nasal cannula oxygen Cardiovascular status: blood pressure returned to baseline and stable Postop Assessment: no apparent nausea or vomiting Anesthetic complications: no    Last Vitals:  Vitals:   10/24/17 1130 10/24/17 1200  BP: (!) 155/104 (!) 156/99  Pulse: 69 78  Resp: 18 17  Temp:  36.5 C  SpO2: 100% 100%    Last Pain:  Vitals:   10/24/17 1231  TempSrc:   PainSc: 0-No pain                 Mychaela Lennartz EDWARD

## 2017-10-24 NOTE — H&P (Signed)
Office Visit Report     09/27/2017   --------------------------------------------------------------------------------   Ryan Newton. Kerstetter  MRN: [id number]  PRIMARY CARE:  Jonathon Jordan, MD  DOB: March 06, 1966, 51 year old Male  REFERRING:  Jonathon Jordan, MD  SSN: -[id number]  PROVIDER:  Raynelle Bring, M.D.    LOCATION:  [facility] [phone]   --------------------------------------------------------------------------------   CC/HPI: Prostate cancer    He was diagnosed with low risk prostate cancer in October 2016 by Dr. Rose Fillers in [place], [place]. He elected initial management with active surveillance. He presented to me in January 2018 after moving to [place], [place]. He underwent a surveillance biopsy in March 2018 that indicated upgraded Gleason 3+4 = 7 cancer. He was last seen by me in April 2018 for discussion regarding treatment of his upgraded Gleason 3+4=7 prostate cancer. At that time he was leaning toward surgical therapy but wished to delay treatment. He has now elected to proceed with surgical therapy and follows up today to discuss proceeding. He denies any changes in his overall health over the past 6 months.   Initial diagnosis: October 2016  TNM stage: cT1c Nx Mx  PSA at diagnosis: 4.41  Gleason score: 3+3=6  Biopsy (Case # [id number] read by Dr. Burnett Harry)- 3/12 cores positive  Left: 10% of tissue involved, 2/6 cores  Right: 5% of tissue involved, 1/6 cores  Prostate volume: 23.8 cc   Surveillance:  Feb 2018: PI-RADS 3 lesion, No EPE, SVI, or LAD  Mar 2018: 16 core biopsy with MR/US fusion - Gleason 3+4=7 adenocarcinoma in 4/16 cores   IPSS: 3  SHIM score: 25   Past medical history: Diabetes and hypertension.  Past surgical history: No abdominal surgeries.     ALLERGIES: Penicillin    MEDICATIONS: Lisinopril 20 mg tablet  Carvedilol 12.5 mg tablet  Levemir  Pioglitazone-Metformin 15 mg-850 mg tablet  Pravastatin Sodium 20 mg  tablet  Victoza 2-Pak 0.6 mg/0.1 ml (18 mg/3 ml) pen injector     GU PSH: Prostate Needle Biopsy - 02/18/2017 Vasectomy    NON-GU PSH: Surgical Pathology, Gross And Microscopic Examination For Prostate Needle - 02/18/2017    GU PMH: Prostate Cancer - 01/05/2017      PMH Notes:   1) Prostate cancer: He was diagnosed with low risk prostate cancer in October 2016 by Dr. Rose Fillers in [place], [place]. He elected initial management with active surveillance. He presented to me in January 2018 after moving to [place], [place].   Initial diagnosis: October 2016  TNM stage: cT1c Nx Mx  PSA at diagnosis: 4.41  Gleason score: 3+3=6  Biopsy (Case # [id number] read by Dr. Burnett Harry)- 3/12 cores positive  Left: 10% of tissue involved, 2/6 cores  Right: 5% of tissue involved, 1/6 cores  Prostate volume: 23.8 cc   Surveillance:  Feb 2018: PI-RADS 3 lesion, No EPE, SVI, or LAD  Mar 2018: 16 core biopsy with MR/US fusion - Gleason 3+4=7 adenocarcinoma in 4/16 cores       NON-GU PMH: Diabetes Type 2 Hypercholesterolemia Hypertension    FAMILY HISTORY: 1 Daughter - Runs in Family Aneurysm - Mother prostate cancer in father - Runs in Family   SOCIAL HISTORY: Marital Status: Married Preferred Language: English; Ethnicity: Not Hispanic Or Latino; Race: Black or African American Current Smoking Status: Patient has never smoked.   Tobacco Use Assessment Completed: Used Tobacco in last 30 days? Does not drink anymore.  Drinks 1 caffeinated drink per day.  REVIEW OF SYSTEMS:    GU Review Male:   Patient denies frequent urination, hard to postpone urination, burning/ pain with urination, get up at night to urinate, leakage of urine, stream starts and stops, trouble starting your streams, and have to strain to urinate .  Gastrointestinal (Lower):   Patient denies diarrhea and constipation.  Gastrointestinal (Upper):   Patient denies nausea and vomiting.  Constitutional:   Patient  denies night sweats, fatigue, weight loss, and fever.  Skin:   Patient denies skin rash/ lesion and itching.  Eyes:   Patient denies blurred vision and double vision.  Ears/ Nose/ Throat:   Patient denies sore throat and sinus problems.  Hematologic/Lymphatic:   Patient denies swollen glands and easy bruising.  Cardiovascular:   Patient denies leg swelling and chest pains.  Respiratory:   Patient denies cough and shortness of breath.  Endocrine:   Patient denies excessive thirst.  Musculoskeletal:   Patient denies back pain and joint pain.  Neurological:   Patient denies headaches and dizziness.  Psychologic:   Patient denies depression and anxiety.   VITAL SIGNS:      09/27/2017 08:39 AM  Weight 245 lb / 111.13 kg  Height 71 in / 180.34 cm  BP 111/74 mmHg  Pulse 80 /min  BMI 34.2 kg/m   GU PHYSICAL EXAMINATION:    Prostate: Prostate about 30 grams. Left lobe normal consistency, right lobe normal consistency. Symmetrical lobes. No prostate nodule. Left lobe no tenderness, right lobe no tenderness.    MULTI-SYSTEM PHYSICAL EXAMINATION:    Constitutional: Well-nourished. No physical deformities. Normally developed. Good grooming.  Neck: Neck symmetrical, not swollen. Normal tracheal position.  Respiratory: No labored breathing, no use of accessory muscles. Clear bilaterally.  Cardiovascular: Normal temperature, normal extremity pulses, no swelling, no varicosities. Regular rate and rhythm.  Lymphatic: No enlargement of neck, axillae, groin.  Skin: No paleness, no jaundice, no cyanosis. No lesion, no ulcer, no rash.  Neurologic / Psychiatric: Oriented to time, oriented to place, oriented to person. No depression, no anxiety, no agitation.  Gastrointestinal: No mass, no tenderness, no rigidity, non obese abdomen.  Eyes: Normal conjunctivae. Normal eyelids.  Ears, Nose, Mouth, and Throat: Left ear no scars, no lesions, no masses. Right ear no scars, no lesions, no masses. Nose no scars,  no lesions, no masses. Normal hearing. Normal lips.  Musculoskeletal: Normal gait and station of head and neck.     PAST DATA REVIEWED:  Source Of History:  Patient  Lab Test Review:   PSA  Records Review:   Previous Patient Records  Urine Test Review:   Urinalysis   01/05/17  PSA  Total PSA 3.85     PROCEDURES:          Urinalysis Dipstick Dipstick Cont'd  Color: Yellow Bilirubin: Neg  Appearance: Clear Ketones: Neg  Specific Gravity: 1.020 Blood: Neg  pH: 6.0 Protein: Neg  Glucose: 3+ Urobilinogen: 0.2    Nitrites: Neg    Leukocyte Esterase: Neg    ASSESSMENT:      ICD-10 Details  1 GU:   Prostate Cancer - C61    PLAN:           Orders Labs PSA          Schedule Return Visit/Planned Activity: Next Available Appointment - Schedule Surgery          Document Letter(s):  Created for Patient: Clinical Summary         Notes:   1. Prostate cancer:  His PSA will be rechecked today. We again reviewed options for treatment and he does adamantly wish to proceed with surgical therapy and feels very well-informed from our prior discussion.   We discussed surgical therapy for prostate cancer including the different available surgical approaches. We discussed, in detail, the risks and expectations of surgery with regard to cancer control, urinary control, and erectile function as well as the expected postoperative recovery process. Additional risks of surgery including but not limited to bleeding, infection, hernia formation, nerve damage, lymphocele formation, bowel/rectal injury potentially necessitating colostomy, damage to the urinary tract resulting in urine leakage, urethral stricture, and the cardiopulmonary risks such as myocardial infarction, stroke, death, venothromboembolism, etc. were explained. The risk of open surgical conversion for robotic/laparoscopic prostatectomy was also discussed.   He will be scheduled for a bilateral nerve sparing robot-assisted laparoscopic  radical prostatectomy and bilateral pelvic lymphadenectomy.   Cc: Dr. Jonathon Jordan      E & M CODE: I spent at least 42 minutes face to face with the patient, more than 50% of that time was spent on counseling and/or coordinating care.     * Signed by Raynelle Bring, M.D. on 09/27/17 at 12:10 PM (EDT)*

## 2017-10-24 NOTE — Interval H&P Note (Signed)
History and Physical Interval Note:  10/24/2017 6:59 AM  Ryan Newton  has presented today for surgery, with the diagnosis of PROSTATE CANCER  The various methods of treatment have been discussed with the patient and family. After consideration of risks, benefits and other options for treatment, the patient has consented to  Procedure(s) with comments: ROBOTIC ASSISTED LAPAROSCOPIC RADICAL PROSTATECTOMY LEVEL 2 (N/A) - NEEDS 210 MIN FOR PROCEDURE LYMPHADENECTOMY, PELVIC (Bilateral) as a surgical intervention .  The patient's history has been reviewed, patient examined, no change in status, stable for surgery.  I have reviewed the patient's chart and labs.  Questions were answered to the patient's satisfaction.     Makhia Vosler,LES

## 2017-10-25 ENCOUNTER — Encounter (HOSPITAL_COMMUNITY): Payer: Self-pay | Admitting: Urology

## 2017-10-25 DIAGNOSIS — C61 Malignant neoplasm of prostate: Secondary | ICD-10-CM | POA: Diagnosis not present

## 2017-10-25 LAB — GLUCOSE, CAPILLARY
GLUCOSE-CAPILLARY: 182 mg/dL — AB (ref 65–99)
GLUCOSE-CAPILLARY: 229 mg/dL — AB (ref 65–99)
Glucose-Capillary: 183 mg/dL — ABNORMAL HIGH (ref 65–99)
Glucose-Capillary: 186 mg/dL — ABNORMAL HIGH (ref 65–99)

## 2017-10-25 LAB — HEMOGLOBIN AND HEMATOCRIT, BLOOD
HEMATOCRIT: 34.5 % — AB (ref 39.0–52.0)
HEMOGLOBIN: 11.2 g/dL — AB (ref 13.0–17.0)

## 2017-10-25 MED ORDER — BISACODYL 10 MG RE SUPP
10.0000 mg | Freq: Once | RECTAL | Status: AC
  Administered 2017-10-25: 10 mg via RECTAL
  Filled 2017-10-25: qty 1

## 2017-10-25 MED ORDER — HYDROCODONE-ACETAMINOPHEN 5-325 MG PO TABS
1.0000 | ORAL_TABLET | Freq: Four times a day (QID) | ORAL | Status: DC | PRN
  Administered 2017-10-25: 1 via ORAL
  Filled 2017-10-25: qty 1

## 2017-10-25 NOTE — Progress Notes (Signed)
Patient ID: Ryan Newton, male   DOB: 1966-11-24, 51 y.o.   MRN: [id number]  1 Day Post-Op Subjective: The patient is doing well.  No nausea or vomiting. Pain is adequately controlled.  Objective: Vital signs in last 24 hours: Temp:  [97.6 F (36.4 C)-99 F (37.2 C)] 98.1 F (36.7 C) (11/20 0443) Pulse Rate:  [69-96] 79 (11/20 0443) Resp:  [15-18] 18 (11/20 0443) BP: (101-156)/(63-104) 101/63 (11/20 0443) SpO2:  [93 %-100 %] 100 % (11/20 0443)  Intake/Output from previous day: 11/19 0701 - 11/20 0700 In: 5110 [P.O.:480; I.V.:4480; IV Piggyback:150] Out: 3875 [Urine:1300; Drains:180; Blood:100] Intake/Output this shift: No intake/output data recorded.  Physical Exam:  General: Alert and oriented. CV: RRR Lungs: Clear bilaterally. GI: Soft, Nondistended. Incisions: Clean, dry, and intact Urine: Clear Extremities: Nontender, no erythema, no edema.  Lab Results: Recent Labs    10/24/17 1146 10/25/17 0429  HGB 12.8* 11.2*  HCT 39.0 34.5*      Assessment/Plan: POD# 1 s/p robotic prostatectomy.  1) SL IVF 2) Ambulate, Incentive spirometry 3) Transition to oral pain medication 4) Dulcolax suppository 5) D/C pelvic drain 6) Plan for likely discharge later today   Pryor Curia. MD   LOS: 0 days   Corde Antonini,LES 10/25/2017, 7:16 AM

## 2017-10-25 NOTE — Progress Notes (Signed)
Pt and Wife given discharge teaching including medications. Home teaching reinforced regarding leg bag and changing from foley to leg bag. Pt able to demonstrate changing and emptied leg bag in bathroom. All other discharge home Prostatectomy instructions gone over with Pt and Wife and understanding verbalized. Discharge packet with Prescription given to Pt.

## 2017-10-25 NOTE — Discharge Summary (Signed)
Date of admission: 10/24/2017  Date of discharge: 10/25/2017  Admission diagnosis: Prostate Cancer  Discharge diagnosis: Prostate Cancer  History and Physical: For full details, please see admission history and physical. Briefly, Ryan Newton is a 51 y.o. gentleman with localized prostate cancer.  After discussing management/treatment options, he elected to proceed with surgical treatment.  Hospital Course: Ryan Newton was taken to the operating room on 10/24/2017 and underwent a robotic assisted laparoscopic radical prostatectomy. He tolerated this procedure well and without complications. Postoperatively, he was able to be transferred to a regular hospital room following recovery from anesthesia.  He was able to begin ambulating the night of surgery. He remained hemodynamically stable overnight.  He had excellent urine output with appropriately minimal output from his pelvic drain and his pelvic drain was removed on POD #1.  He was transitioned to oral pain medication, tolerated a clear liquid diet, and had met all discharge criteria and was able to be discharged home later on POD#1.  Laboratory values:  Recent Labs    10/24/17 1146 10/25/17 0429  HGB 12.8* 11.2*  HCT 39.0 34.5*    Disposition: Home  Discharge instruction: He was instructed to be ambulatory but to refrain from heavy lifting, strenuous activity, or driving. He was instructed on urethral catheter care.  Discharge medications:   Allergies as of 10/25/2017      Reactions   Penicillins Hives   Has patient had a PCN reaction causing immediate rash, facial/tongue/throat swelling, SOB or lightheadedness with hypotension: No Has patient had a PCN reaction causing severe rash involving mucus membranes or skin necrosis: Yes Has patient had a PCN reaction that required hospitalization: No Has patient had a PCN reaction occurring within the last 10 years: No If all of the above answers are "NO", then may proceed with  Cephalosporin use.      Medication List    STOP taking these medications   ibuprofen 200 MG tablet Commonly known as:  ADVIL,MOTRIN     TAKE these medications   acetaminophen 500 MG tablet Commonly known as:  TYLENOL Take 1,000 mg daily as needed by mouth for moderate pain or headache.   carvedilol 12.5 MG tablet Commonly known as:  COREG Take 12.5 mg 2 (two) times daily by mouth.   glimepiride 4 MG tablet Commonly known as:  AMARYL Take 4 mg daily by mouth.   HYDROcodone-acetaminophen 5-325 MG tablet Commonly known as:  NORCO Take 1-2 tablets every 6 (six) hours as needed by mouth for moderate pain or severe pain.   insulin detemir 100 UNIT/ML injection Commonly known as:  LEVEMIR Inject 30 Units daily into the skin.   lisinopril 20 MG tablet Commonly known as:  PRINIVIL,ZESTRIL Take 20 mg daily by mouth.   loratadine 10 MG tablet Commonly known as:  CLARITIN Take 10 mg daily as needed by mouth.   pioglitazone-metformin 15-850 MG tablet Commonly known as:  ACTOPLUS MET Take 1 tablet 2 (two) times daily by mouth.   pravastatin 20 MG tablet Commonly known as:  PRAVACHOL Take 20 mg every evening by mouth.   sulfamethoxazole-trimethoprim 800-160 MG tablet Commonly known as:  BACTRIM DS,SEPTRA DS Take 1 tablet 2 (two) times daily by mouth. Start the day prior to foley removal appointment   triamcinolone cream 0.5 % Commonly known as:  KENALOG Apply 1 application daily as needed topically (eczema).       Followup: He will followup in 1 week for catheter removal and to discuss his surgical pathology results.

## 2018-03-15 IMAGING — MR MR PROSTATE WO/W CM
56 series · 56 of 56 positions shown · IV contrast (Multihance 20cc)
Comparison: None.

CLINICAL DATA: All prostate cancer. Gleason 3 + 3 equals 6 on
biopsies from September 2015

EXAM:
MR PROSTATE WITHOUT AND WITH CONTRAST
TECHNIQUE: Multiplanar multisequence MRI images were obtained of the pelvis
centered about the prostate. Pre and post contrast images were
obtained.
CONTRAST:  20mL MULTIHANCE GADOBENATE DIMEGLUMINE 529 MG/ML IV SOLN
Creatinine was obtained on site at [HOSPITAL] at [HOSPITAL].
Results: Creatinine 1.0 mg/dL.

[Series 3: T1 · axial · 8.0mm · 1.06mm/px · 1 of 28 slices shown (1 of 2)]
[im 1/28]
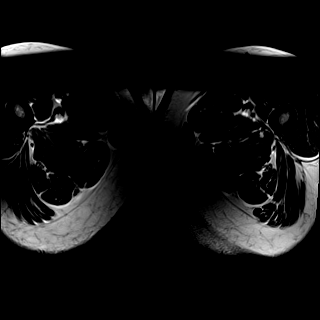

[Series 4: bSSFP fat-sat · axial · 8.0mm · 0.74mm/px · 1 of 28 slices shown]
[im 1/28]
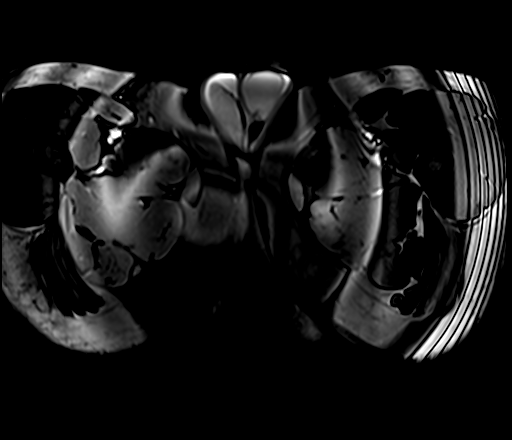

[Series 5: T2 · sagittal · 3.5mm · 0.56mm/px · 1 of 39 slices shown (1 of 4)]
[im 1/39]
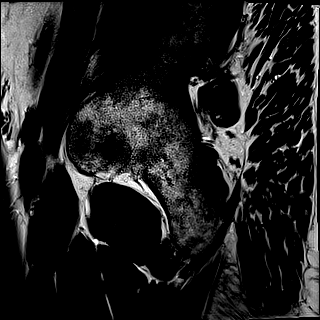

[Series 6: T1 · axial · 3.0mm · 0.31mm/px · 1 of 28 slices shown (2 of 2)]
[im 1/28]
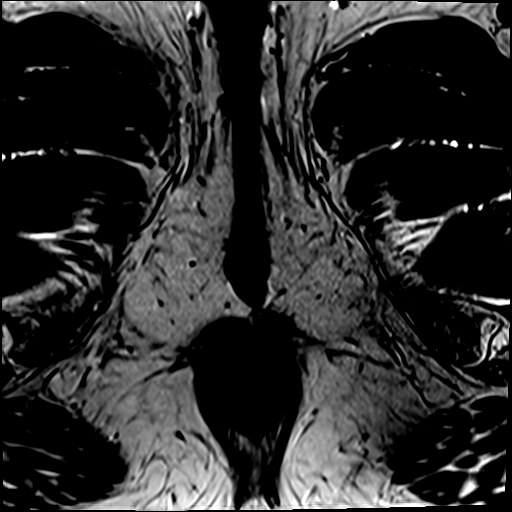

[Series 7: T2 · axial · 3.5mm · 0.56mm/px · 1 of 20 slices shown (2 of 4)]
[im 1/20]
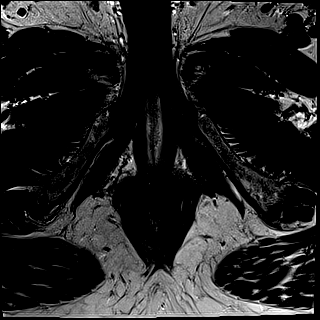

[Series 8: T2 · axial · 1.0mm · 1.04mm/px · 1 of 80 slices shown (3 of 4)]
[im 1/80]
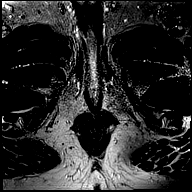

[Series 9: T2 · coronal · 3.5mm · 0.56mm/px · 1 of 20 slices shown (4 of 4)]
[im 1/20]
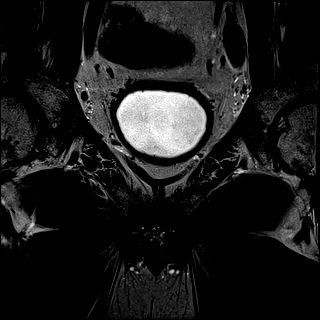

[Series 10: DWI · axial · 3.5mm · 1.56mm/px · 1 of 56 slices shown (1 of 2)]
[im 1/56]
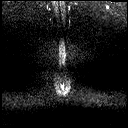

[Series 11: DWI · axial · 3.5mm · 1.56mm/px · 1 of 20 slices shown (2 of 2)]
[im 1/20]
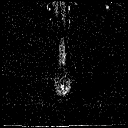

[Series 12: t1_twist_tra_dyn_ttc=5.3s · axial · 3.5mm · 0.83mm/px · 1 of 20 slices shown]
[im 1/20]
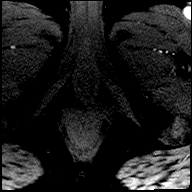

[Series 13: t1_twist_tra_dyn-copy center to · axial · 3.5mm · 0.83mm/px · 1 of 26 slices shown (1 of 24)]
[im 1/26]
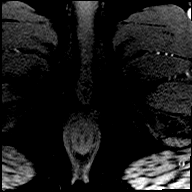

[Series 14: t1_twist_tra_dyn-copy center to · axial · 3.5mm · 0.83mm/px · 1 of 26 slices shown (2 of 24)]
[im 1/26]
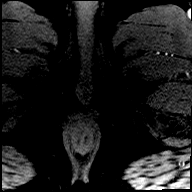

[Series 15: t1_twist_tra_dyn-copy center to_sub_ttc=(id) · axial · 3.5mm · 0.83mm/px · 1 of 24 slices shown (1 of 22)]
[im 1/24]
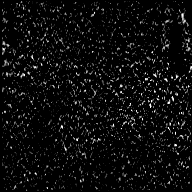

[Series 16: t1_twist_tra_dyn-copy center to · axial · 3.5mm · 0.83mm/px · 1 of 26 slices shown (3 of 24)]
[im 1/26]
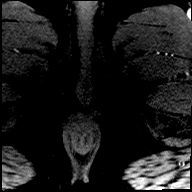

[Series 17: t1_twist_tra_dyn-copy center to_sub_ttc=(id) · axial · 3.5mm · 0.83mm/px · 1 of 25 slices shown (2 of 22)]
[im 1/25]
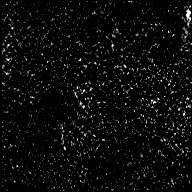

[Series 18: t1_twist_tra_dyn-copy center to · axial · 3.5mm · 0.83mm/px · 1 of 26 slices shown (4 of 24)]
[im 1/26]
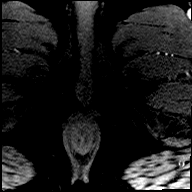

[Series 19: t1_twist_tra_dyn-copy center to_sub_ttc=(id) · axial · 3.5mm · 0.83mm/px · 1 of 25 slices shown (3 of 22)]
[im 1/25]
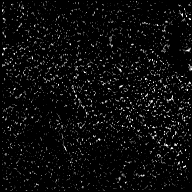

[Series 20: t1_twist_tra_dyn-copy center to · axial · 3.5mm · 0.83mm/px · 1 of 26 slices shown (5 of 24)]
[im 1/26]
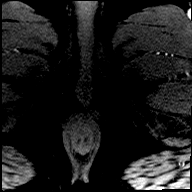

[Series 21: t1_twist_tra_dyn-copy center to_sub_ttc=(id) · axial · 3.5mm · 0.83mm/px · 1 of 26 slices shown (4 of 22)]
[im 1/26]
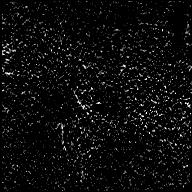

[Series 22: t1_twist_tra_dyn-copy center to · axial · 3.5mm · 0.83mm/px · 1 of 26 slices shown (6 of 24)]
[im 1/26]
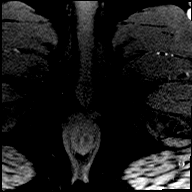

[Series 23: t1_twist_tra_dyn-copy center to_sub_ttc=(id) · axial · 3.5mm · 0.83mm/px · 1 of 26 slices shown (5 of 22)]
[im 1/26]
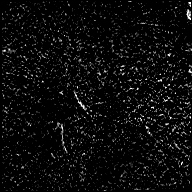

[Series 24: t1_twist_tra_dyn-copy center to · axial · 3.5mm · 0.83mm/px · 1 of 26 slices shown (7 of 24)]
[im 1/26]
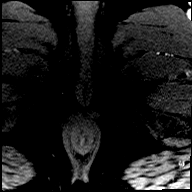

[Series 25: t1_twist_tra_dyn-copy center to_sub_ttc=(id) · axial · 3.5mm · 0.83mm/px · 1 of 26 slices shown (6 of 22)]
[im 1/26]
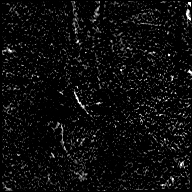

[Series 26: t1_twist_tra_dyn-copy center to · axial · 3.5mm · 0.83mm/px · 1 of 26 slices shown (8 of 24)]
[im 1/26]
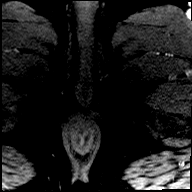

[Series 27: t1_twist_tra_dyn-copy center to_sub_ttc=(id) · axial · 3.5mm · 0.83mm/px · 1 of 26 slices shown (7 of 22)]
[im 1/26]
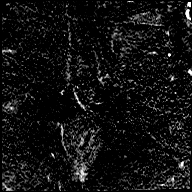

[Series 28: t1_twist_tra_dyn-copy center to · axial · 3.5mm · 0.83mm/px · 1 of 26 slices shown (9 of 24)]
[im 1/26]
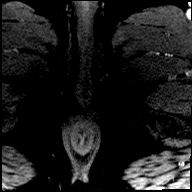

[Series 29: t1_twist_tra_dyn-copy center to_sub_ttc=(id) · axial · 3.5mm · 0.83mm/px · 1 of 23 slices shown (8 of 22)]
[im 1/23]
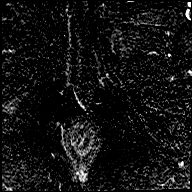

[Series 30: t1_twist_tra_dyn-copy center to · axial · 3.5mm · 0.83mm/px · 1 of 26 slices shown (10 of 24)]
[im 1/26]
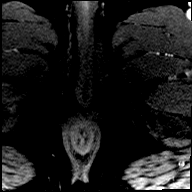

[Series 31: t1_twist_tra_dyn-copy center to_sub_ttc=(id) · axial · 3.5mm · 0.83mm/px · 1 of 26 slices shown (9 of 22)]
[im 1/26]
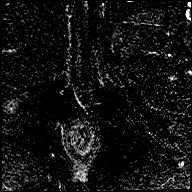

[Series 32: t1_twist_tra_dyn-copy center to · axial · 3.5mm · 0.83mm/px · 1 of 26 slices shown (11 of 24)]
[im 1/26]
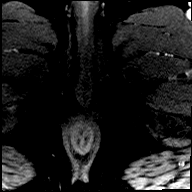

[Series 33: t1_twist_tra_dyn-copy center to_sub_ttc=(id) · axial · 3.5mm · 0.83mm/px · 1 of 26 slices shown (10 of 22)]
[im 1/26]
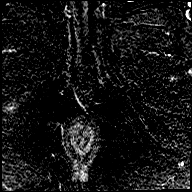

[Series 34: t1_twist_tra_dyn-copy center to · axial · 3.5mm · 0.83mm/px · 1 of 26 slices shown (12 of 24)]
[im 1/26]
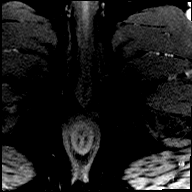

[Series 35: t1_twist_tra_dyn-copy center to_sub_ttc=(id) · axial · 3.5mm · 0.83mm/px · 1 of 26 slices shown (11 of 22)]
[im 1/26]
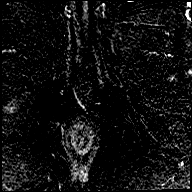

[Series 36: t1_twist_tra_dyn-copy center to · axial · 3.5mm · 0.83mm/px · 1 of 26 slices shown (13 of 24)]
[im 1/26]
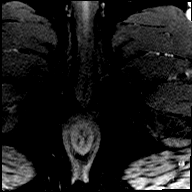

[Series 37: t1_twist_tra_dyn-copy center to_sub_ttc=(id) · axial · 3.5mm · 0.83mm/px · 1 of 25 slices shown (12 of 22)]
[im 1/25]
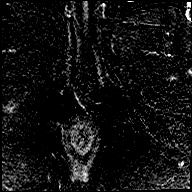

[Series 38: t1_twist_tra_dyn-copy center to · axial · 3.5mm · 0.83mm/px · 1 of 26 slices shown (14 of 24)]
[im 1/26]
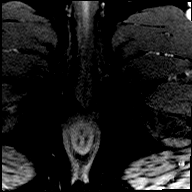

[Series 39: t1_twist_tra_dyn-copy center to_sub_ttc=(id) · axial · 3.5mm · 0.83mm/px · 1 of 26 slices shown (13 of 22)]
[im 1/26]
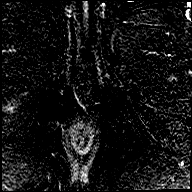

[Series 40: t1_twist_tra_dyn-copy center to · axial · 3.5mm · 0.83mm/px · 1 of 26 slices shown (15 of 24)]
[im 1/26]
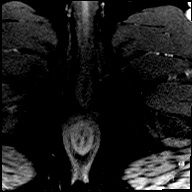

[Series 41: t1_twist_tra_dyn-copy center to_sub_ttc=(id) · axial · 3.5mm · 0.83mm/px · 1 of 26 slices shown (14 of 22)]
[im 1/26]
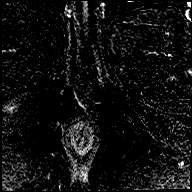

[Series 42: t1_twist_tra_dyn-copy center to · axial · 3.5mm · 0.83mm/px · 1 of 26 slices shown (16 of 24)]
[im 1/26]
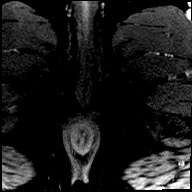

[Series 43: t1_twist_tra_dyn-copy center to_sub_ttc=(id) · axial · 3.5mm · 0.83mm/px · 1 of 26 slices shown (15 of 22)]
[im 1/26]
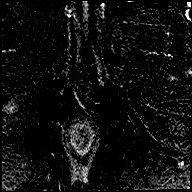

[Series 44: t1_twist_tra_dyn-copy center to · axial · 3.5mm · 0.83mm/px · 1 of 26 slices shown (17 of 24)]
[im 1/26]
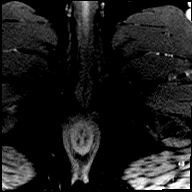

[Series 45: t1_twist_tra_dyn-copy center to_sub_ttc=(id) · axial · 3.5mm · 0.83mm/px · 1 of 26 slices shown (16 of 22)]
[im 1/26]
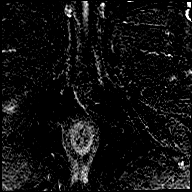

[Series 46: t1_twist_tra_dyn-copy center to · axial · 3.5mm · 0.83mm/px · 1 of 26 slices shown (18 of 24)]
[im 1/26]
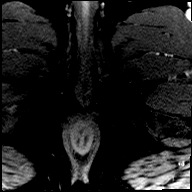

[Series 47: t1_twist_tra_dyn-copy center to_sub_ttc=(id) · axial · 3.5mm · 0.83mm/px · 1 of 26 slices shown (17 of 22)]
[im 1/26]
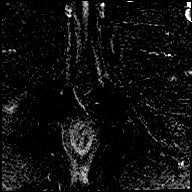

[Series 48: t1_twist_tra_dyn-copy center to · axial · 3.5mm · 0.83mm/px · 1 of 26 slices shown (19 of 24)]
[im 1/26]
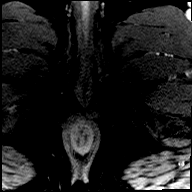

[Series 49: t1_twist_tra_dyn-copy center to_sub_ttc=(id) · axial · 3.5mm · 0.83mm/px · 1 of 26 slices shown (18 of 22)]
[im 1/26]
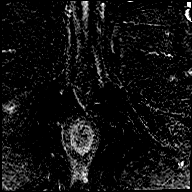

[Series 50: t1_twist_tra_dyn-copy center to · axial · 3.5mm · 0.83mm/px · 1 of 26 slices shown (20 of 24)]
[im 1/26]
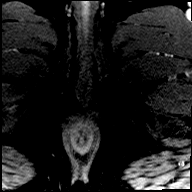

[Series 51: t1_twist_tra_dyn-copy center to_sub_ttc=(id) · axial · 3.5mm · 0.83mm/px · 1 of 26 slices shown (19 of 22)]
[im 1/26]
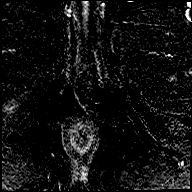

[Series 52: t1_twist_tra_dyn-copy center to · axial · 3.5mm · 0.83mm/px · 1 of 26 slices shown (21 of 24)]
[im 1/26]
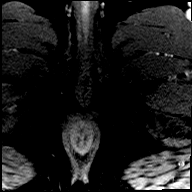

[Series 53: t1_twist_tra_dyn-copy center to_sub_ttc=(id) · axial · 3.5mm · 0.83mm/px · 1 of 26 slices shown (20 of 22)]
[im 1/26]
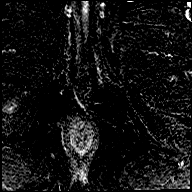

[Series 54: t1_twist_tra_dyn-copy center to · axial · 3.5mm · 0.83mm/px · 1 of 26 slices shown (22 of 24)]
[im 1/26]
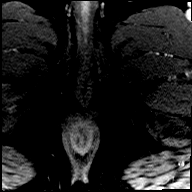

[Series 55: t1_twist_tra_dyn-copy center to_sub_ttc=(id) · axial · 3.5mm · 0.83mm/px · 1 of 26 slices shown (21 of 22)]
[im 1/26]
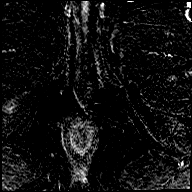

[Series 56: t1_twist_tra_dyn-copy center to · axial · 3.5mm · 0.83mm/px · 1 of 26 slices shown (23 of 24)]
[im 1/26]
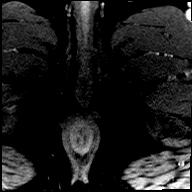

[Series 57: t1_twist_tra_dyn-copy center to_sub_ttc=(id) · axial · 3.5mm · 0.83mm/px · 1 of 26 slices shown (22 of 22)]
[im 1/26]
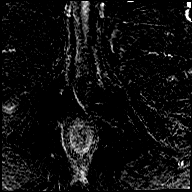

[Series 58: t1_twist_tra_dyn-copy center to · axial · 3.5mm · 0.83mm/px · 1 of 26 slices shown (24 of 24)]
[im 1/26]
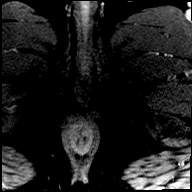

[56 of 56 positions shown; findings below may reference images not displayed]

FINDINGS: Prostate: Within the RIGHT mid gland peripheral zone there is a
region of loss of its normal high T2 signal intensity measuring 12
mm x 8 mm (image 11, series 7). This corresponds to a more subtle
diffusion abnormality on image 10, series 11.

Within the LEFT gland at similar level there is a band of high
signal intensity on T1 and T2 weighted imaging consists with post
biopsy hemorrhage. No clear abnormal enhancement within the
peripheral zone.

The transitional zone is small.

The seminal vesicles are normal.

The prostatic capsule is intact.

Volume: Prostate gland measures 4.0 by 3.7 by 3.7 cm (volume = 29
cm^3).

Transcapsular spread:  Absent

Seminal vesicle involvement: Absent

Neurovascular bundle involvement: Absent

Pelvic adenopathy: Absent

Bone metastasis: Absent

Other findings: None
IMPRESSION: 1. Focus of T2 signal abnormality within the RIGHT mid gland
peripheral zone with questionable restricted diffusion.
Indeterminate PI-RADS III lesion.
2. Biopsy hemorrhage artifact in the LEFT peripheral zone.
3. Normal transitional zone.

## 2018-09-19 ENCOUNTER — Ambulatory Visit: Payer: PRIVATE HEALTH INSURANCE | Admitting: Registered"

## 2018-10-16 ENCOUNTER — Encounter: Payer: Self-pay | Admitting: Registered"

## 2018-10-16 ENCOUNTER — Encounter: Payer: PRIVATE HEALTH INSURANCE | Attending: Internal Medicine | Admitting: Registered"

## 2018-10-16 DIAGNOSIS — E669 Obesity, unspecified: Secondary | ICD-10-CM | POA: Diagnosis present

## 2018-10-16 DIAGNOSIS — E119 Type 2 diabetes mellitus without complications: Secondary | ICD-10-CM | POA: Diagnosis not present

## 2018-10-16 DIAGNOSIS — E1165 Type 2 diabetes mellitus with hyperglycemia: Secondary | ICD-10-CM

## 2018-10-16 NOTE — Progress Notes (Signed)
Diabetes Self-Management Education  Visit Type: First/Initial  Appt. Start Time: 1600 Appt. End Time: 6295  10/16/2018  [name], identified by name and date of birth, is a 52 y.o. male with a diagnosis of Diabetes: Type 2.   ASSESSMENT Patient states he is here to learn how to bring down his A1c, reduce his sugar spikes and improve his overall health.  Patient states he got serious about getting better control of his blood sugar when he went to the endocrinologist and had some medication changes. Pt states prior to that he was on DM medications that caused weight gain and repots since changes to medications he doesn't feel as hungry.  Pt states he also became motivated to continue to do better when he saw improvement in his blood sugar readings. Patient states he does not regularly drive through [employer] anymore, stopped drinking juice and sweet tea.  Pt states he has made more of an effort to walk more at work, but is not doing structured exercise.  Patient came up with an analogy of eating protein and non-starchy vegetables as a way to prevent the fire from carbohydrates.  Diabetes Self-Management Education - 10/16/18 1612      Visit Information   Visit Type  First/Initial      Initial Visit   Diabetes Type  Type 2    Are you currently following a meal plan?  No    Are you taking your medications as prescribed?  Yes    Date Diagnosed  10 years ago      Health Coping   How would you rate your overall health?  Good      Psychosocial Assessment   Patient Belief/Attitude about Diabetes  Motivated to manage diabetes    How often do you need to have someone help you when you read instructions, pamphlets, or other written materials from your doctor or pharmacy?  1 - Never    What is the last grade level you completed in school?  BA      Complications   Last HgB A1C per patient/outside source  10.1 %    How often do you check your blood sugar?  1-2 times/day    Fasting  Blood glucose range (mg/dL)  130-179;180-200;>200    Postprandial Blood glucose range (mg/dL)  130-179;180-200;>200   172-316   Number of hypoglycemic episodes per month  0    Number of hyperglycemic episodes per week  --   appears >50% of the time   Have you had a dilated eye exam in the past 12 months?  Yes    Have you had a dental exam in the past 12 months?  Yes    Are you checking your feet?  No      Dietary Intake   Breakfast  weekend toast, sausage, eggs, diet cranberry OR smoked sausage dog on road OR cafeteria pizza    Snack (morning)  none OR banana OR sugar free candy    Lunch  school lunch OR sandwich wheat    Snack (afternoon)  fruit OR chips    Dinner  chicken salad, soup OR fried chicken     Snack (evening)  none OR     Beverage(s)  water, diet cranberry, diet soda      Exercise   Exercise Type  Light (walking / raking leaves)    How many days per week to you exercise?  2    How many minutes per day do you exercise?  15    Total minutes per week of exercise  30      Patient Education   Previous Diabetes Education  Yes (please comment)   2016   Disease state   Definition of diabetes, type 1 and 2, and the diagnosis of diabetes    Nutrition management   Role of diet in the treatment of diabetes and the relationship between the three main macronutrients and blood glucose level    Physical activity and exercise   Role of exercise on diabetes management, blood pressure control and cardiac health.    Monitoring  Other (comment);Purpose and frequency of SMBG.   use of meter phone app     Individualized Goals (developed by patient)   Nutrition  General guidelines for healthy choices and portions discussed    Physical Activity  Exercise 3-5 times per week    Monitoring   test my blood glucose as discussed      Outcomes   Expected Outcomes  Demonstrated interest in learning. Expect positive outcomes    Future DMSE  4-6 wks    Program Status  Completed       Individualized Plan for Diabetes Self-Management Training:   Learning Objective:  Patient will have a greater understanding of diabetes self-management. Patient education plan is to attend individual and/or group sessions per assessed needs and concerns.  Patient Instructions  Consider eating 100% whole wheat Aim to eat balanced meals and snacks Consider checking your blood sugar: fasting and 2 hours after some meals Use your mobile app to help you see patterns and make adjustments to your food and exercise.   Expected Outcomes:  Demonstrated interest in learning. Expect positive outcomes  Education material provided: ADA Diabetes: Your Take Control Guide, A1C conversion sheet and Snack sheet  If problems or questions, patient to contact team via:  Phone and MyChart  Future DSME appointment: 4-6 wks

## 2018-10-16 NOTE — Patient Instructions (Addendum)
Consider eating 100% whole wheat Aim to eat balanced meals and snacks Consider checking your blood sugar: fasting and 2 hours after some meals Use your mobile app to help you see patterns and make adjustments to your food and exercise.

## 2018-11-22 ENCOUNTER — Ambulatory Visit: Payer: PRIVATE HEALTH INSURANCE | Admitting: Registered"

## 2018-12-28 ENCOUNTER — Encounter: Payer: PRIVATE HEALTH INSURANCE | Attending: Internal Medicine | Admitting: Registered"

## 2018-12-28 DIAGNOSIS — E119 Type 2 diabetes mellitus without complications: Secondary | ICD-10-CM | POA: Insufficient documentation

## 2018-12-28 DIAGNOSIS — E1165 Type 2 diabetes mellitus with hyperglycemia: Secondary | ICD-10-CM

## 2018-12-28 DIAGNOSIS — E669 Obesity, unspecified: Secondary | ICD-10-CM | POA: Insufficient documentation

## 2018-12-28 NOTE — Progress Notes (Signed)
Diabetes Self-Management Education  Visit Type: Follow-up  Appt. Start Time: 1645 Appt. End Time: 1610  12/28/2018  [name], identified by name and date of birth, is a 53 y.o. male with a diagnosis of Diabetes: Type 2.   ASSESSMENT  There were no vitals taken for this visit. Pt declined There is no height or weight on file to calculate BMI.   Pt is using the one touch app to track blood sugar and is making notes to help him remember what makes it go too high, eg a FBG 439 in December, ate late and splurged the night before, recorded food intake. Patient has worked to really reduce sugar in diet.  Pt states MD said he may be able to discontinue long acting insulin. Pt states he adjusts when he takes Toujeo. If BG too low, eg yesterday it was 107 mg/dL, he won't take in am and instead will take in evening. Overall his numbers are trending down. RD reviewed rotation of injection sites. Pt reports proper technique other than that.  Where patient can next improve will be to increase exercise. Although he does not have much extra space in his schedule, he believes that he and his family could to some movement, jumping around or using the nintendo in the evenings.  Diabetes Self-Management Education - 12/28/18 1703      Visit Information   Visit Type  Follow-up      Initial Visit   Diabetes Type  Type 2    Are you taking your medications as prescribed?  No      Complications   Fasting Blood glucose range (mg/dL)  70-129;130-179;180-200      Dietary Intake   Breakfast  sandwich ww bread    Snack (morning)  none    Lunch  vegetable beef stew, banana    Snack (afternoon)  pistachios    Dinner  popeyes chicken, dirty rice    Snack (evening)  none    Beverage(s)  water, diet soda, diet tea      Exercise   Exercise Type  ADL's    How many days per week to you exercise?  0    How many minutes per day do you exercise?  0    Total minutes per week of exercise  0       Individualized Goals (developed by patient)   Nutrition  General guidelines for healthy choices and portions discussed    Physical Activity  Exercise 3-5 times per week    Medications  take my medication as prescribed      Patient Self-Evaluation of Goals - Patient rates self as meeting previously set goals (% of time)   Nutrition  >75%    Physical Activity  < 25%    Monitoring  >75%      Outcomes   Expected Outcomes  Demonstrated interest in learning. Expect positive outcomes    Future DMSE  2 months    Program Status  Completed      Subsequent Visit   Since your last visit have you experienced any weight changes?  Loss    Weight Loss (lbs)  5    Since your last visit, are you checking your blood glucose at least once a day?  Yes      Individualized Plan for Diabetes Self-Management Training:   Learning Objective:  Patient will have a greater understanding of diabetes self-management. Patient education plan is to attend individual and/or group sessions per assessed needs and  concerns.   Patient Instructions  Continue eating balanced meals and snacks, get in a few more veggies if you can. Play around with ideas of how you can get more exercise into your evening routine. Continue checking your blood sugar as directed by your MD.  Expected Outcomes:  Demonstrated interest in learning. Expect positive outcomes  Education material provided: medications list  If problems or questions, patient to contact team via:  Phone  Future DSME appointment: 2 months

## 2018-12-28 NOTE — Patient Instructions (Signed)
Continue eating balanced meals and snacks, get in a few more veggies if you can. Play around with ideas of how you can get more exercise into your evening routine. Continue checking your blood sugar as directed by your MD.

## 2019-02-20 ENCOUNTER — Ambulatory Visit: Payer: PRIVATE HEALTH INSURANCE | Admitting: Registered"

## 2019-07-06 ENCOUNTER — Ambulatory Visit: Payer: PRIVATE HEALTH INSURANCE | Admitting: Podiatry

## 2019-07-06 ENCOUNTER — Other Ambulatory Visit: Payer: Self-pay

## 2019-07-06 ENCOUNTER — Encounter: Payer: Self-pay | Admitting: Podiatry

## 2019-07-06 VITALS — BP 112/68 | HR 90 | Temp 97.2°F

## 2019-07-06 DIAGNOSIS — L600 Ingrowing nail: Secondary | ICD-10-CM | POA: Diagnosis not present

## 2019-07-06 DIAGNOSIS — B351 Tinea unguium: Secondary | ICD-10-CM | POA: Diagnosis not present

## 2019-07-06 DIAGNOSIS — M79675 Pain in left toe(s): Secondary | ICD-10-CM | POA: Diagnosis not present

## 2019-07-06 DIAGNOSIS — M79674 Pain in right toe(s): Secondary | ICD-10-CM

## 2019-07-06 DIAGNOSIS — E1140 Type 2 diabetes mellitus with diabetic neuropathy, unspecified: Secondary | ICD-10-CM | POA: Diagnosis not present

## 2019-07-06 DIAGNOSIS — E1149 Type 2 diabetes mellitus with other diabetic neurological complication: Secondary | ICD-10-CM

## 2019-07-06 MED ORDER — NEOMYCIN-POLYMYXIN-HC 3.5-10000-1 OT SOLN: OTIC | 0 refills | Status: AC

## 2019-07-06 NOTE — Progress Notes (Signed)
Subjective:   Patient ID: Ryan Newton, male   DOB: 53 y.o.   MRN: [id number]   HPI Patient presents with chronic ingrown toenail of both big toes and thick brittle nailbeds he cannot cut with long-term history of diabetes and increased difficulty due to the thickness of the nailbeds.  Patient states his sugars been under reasonably good control and has had it for 14 years with last A1c being 7.3 and patient does not smoke and likes to be active   Review of Systems  All other systems reviewed and are negative.       Objective:  Physical Exam Vitals signs and nursing note reviewed.  Constitutional:      Appearance: He is well-developed.  Pulmonary:     Effort: Pulmonary effort is normal.  Musculoskeletal: Normal range of motion.  Skin:    General: Skin is warm.  Neurological:     Mental Status: He is alert.     Vascular status found to be intact with mild diminishment of neurological with occasional numbness symptoms the patient exhibits.  Patient is found to have incurvated medial border left hallux lateral border right hallux with thick nailbeds 1-5 both feet that are impossible for him to cut.  Patient was noted to have good digital perfusion well oriented x3     Assessment:  Chronic ingrown toenail deformity hallux bilateral with pain but no indications of infection with mycotic nail infection 1-5 both feet     Plan:  H&P all conditions discussed and I discussed correction of the ingrown toenail which patient wants done.  I explained procedures risk and patient wants surgery and signed consent form.  Today I infiltrated each hallux 60 mg like Marcaine mixture remove the medial border of the left hallux lateral border right hallux exposed matrix and applied phenol 3 applications 30 seconds followed by alcohol lavage sterile dressing.  Gave instructions on soaks and applied sterile dressings and instructed on leaving dressings on 24 hours but take them off earlier if throbbing  were to occur and wrote prescriptions for drops and encouraged to call with questions.  I debrided all remaining nails and this will be done routinely every 10 weeks by physician

## 2019-07-06 NOTE — Patient Instructions (Signed)

## 2019-09-21 ENCOUNTER — Ambulatory Visit (INDEPENDENT_AMBULATORY_CARE_PROVIDER_SITE_OTHER): Payer: PRIVATE HEALTH INSURANCE | Admitting: Podiatry

## 2019-09-21 ENCOUNTER — Encounter: Payer: Self-pay | Admitting: Podiatry

## 2019-09-21 ENCOUNTER — Other Ambulatory Visit: Payer: Self-pay

## 2019-09-21 DIAGNOSIS — M79675 Pain in left toe(s): Secondary | ICD-10-CM

## 2019-09-21 DIAGNOSIS — E1140 Type 2 diabetes mellitus with diabetic neuropathy, unspecified: Secondary | ICD-10-CM | POA: Diagnosis not present

## 2019-09-21 DIAGNOSIS — M79674 Pain in right toe(s): Secondary | ICD-10-CM

## 2019-09-21 DIAGNOSIS — B351 Tinea unguium: Secondary | ICD-10-CM | POA: Diagnosis not present

## 2019-09-21 DIAGNOSIS — E1149 Type 2 diabetes mellitus with other diabetic neurological complication: Secondary | ICD-10-CM

## 2019-09-21 NOTE — Progress Notes (Signed)
Complaint:  Visit Type: Patient returns to my office for continued preventative foot care services. Complaint: Patient states" my nails have grown long and thick and become painful to walk and wear shoes" Patient has been diagnosed with DM with no foot complications. The patient presents for preventative foot care services. Patient had nail surgery by Dr.  Paulla Dolly.  Podiatric Exam: Vascular: dorsalis pedis and posterior tibial pulses are palpable bilateral. Capillary return is immediate. Temperature gradient is WNL. Skin turgor WNL  Sensorium: Normal Semmes Weinstein monofilament test. Normal tactile sensation bilaterally. Nail Exam: Pt has thick disfigured discolored nails with subungual debris noted bilateral entire nail hallux through fifth toenails Ulcer Exam: There is no evidence of ulcer or pre-ulcerative changes or infection. Orthopedic Exam: Muscle tone and strength are WNL. No limitations in general ROM. No crepitus or effusions noted. Foot type and digits show no abnormalities. Bony prominences are unremarkable. Skin: No Porokeratosis. No infection or ulcers  Diagnosis:  Onychomycosis, , Pain in right toe, pain in left toes  Treatment & Plan Procedures and Treatment: Consent by patient was obtained for treatment procedures.   Debridement of mycotic and hypertrophic toenails, 1 through 5 bilateral and clearing of subungual debris. No ulceration, no infection noted.  Return Visit-Office Procedure: Patient instructed to return to the office for a follow up visit 3 months for continued evaluation and treatment.    Gardiner Barefoot DPM

## 2019-12-21 ENCOUNTER — Other Ambulatory Visit: Payer: Self-pay

## 2019-12-21 ENCOUNTER — Ambulatory Visit: Payer: PRIVATE HEALTH INSURANCE | Admitting: Podiatry

## 2019-12-21 ENCOUNTER — Encounter: Payer: Self-pay | Admitting: Podiatry

## 2019-12-21 DIAGNOSIS — M79674 Pain in right toe(s): Secondary | ICD-10-CM

## 2019-12-21 DIAGNOSIS — E1140 Type 2 diabetes mellitus with diabetic neuropathy, unspecified: Secondary | ICD-10-CM | POA: Diagnosis not present

## 2019-12-21 DIAGNOSIS — M79675 Pain in left toe(s): Secondary | ICD-10-CM | POA: Diagnosis not present

## 2019-12-21 DIAGNOSIS — B351 Tinea unguium: Secondary | ICD-10-CM

## 2019-12-21 DIAGNOSIS — E1149 Type 2 diabetes mellitus with other diabetic neurological complication: Secondary | ICD-10-CM

## 2019-12-21 NOTE — Progress Notes (Signed)
Complaint:  Visit Type: Patient returns to my office for continued preventative foot care services. Complaint: Patient states" my nails have grown long and thick and become painful to walk and wear shoes" Patient has been diagnosed with DM with no foot complications. The patient presents for preventative foot care services. Patient had nail surgery by Dr.  Paulla Dolly.  Podiatric Exam: Vascular: dorsalis pedis and posterior tibial pulses are palpable bilateral. Capillary return is immediate. Temperature gradient is WNL. Skin turgor WNL  Sensorium: Normal Semmes Weinstein monofilament test. Normal tactile sensation bilaterally. Nail Exam: Pt has thick disfigured discolored nails with subungual debris noted bilateral entire nail hallux through fifth toenails Ulcer Exam: There is no evidence of ulcer or pre-ulcerative changes or infection. Orthopedic Exam: Muscle tone and strength are WNL. No limitations in general ROM. No crepitus or effusions noted. Foot type and digits show no abnormalities. Bony prominences are unremarkable. Skin: No Porokeratosis. No infection or ulcers  Diagnosis:  Onychomycosis, , Pain in right toe, pain in left toes  Treatment & Plan Procedures and Treatment: Consent by patient was obtained for treatment procedures.   Debridement of mycotic and hypertrophic toenails, 1 through 5 bilateral and clearing of subungual debris. No ulceration, no infection noted.  Return Visit-Office Procedure: Patient instructed to return to the office for a follow up visit 3 months for continued evaluation and treatment.    Gardiner Barefoot DPM

## 2020-03-21 ENCOUNTER — Encounter: Payer: Self-pay | Admitting: Podiatry

## 2020-03-21 ENCOUNTER — Other Ambulatory Visit: Payer: Self-pay

## 2020-03-21 ENCOUNTER — Ambulatory Visit: Payer: PRIVATE HEALTH INSURANCE | Admitting: Podiatry

## 2020-03-21 VITALS — Temp 96.4°F

## 2020-03-21 DIAGNOSIS — M205X2 Other deformities of toe(s) (acquired), left foot: Secondary | ICD-10-CM | POA: Diagnosis not present

## 2020-03-21 DIAGNOSIS — M205X1 Other deformities of toe(s) (acquired), right foot: Secondary | ICD-10-CM | POA: Diagnosis not present

## 2020-03-21 DIAGNOSIS — B351 Tinea unguium: Secondary | ICD-10-CM

## 2020-03-21 DIAGNOSIS — M79675 Pain in left toe(s): Secondary | ICD-10-CM

## 2020-03-21 DIAGNOSIS — M779 Enthesopathy, unspecified: Secondary | ICD-10-CM

## 2020-03-21 DIAGNOSIS — E1140 Type 2 diabetes mellitus with diabetic neuropathy, unspecified: Secondary | ICD-10-CM

## 2020-03-21 NOTE — Progress Notes (Signed)
This patient returns to my office for at risk foot care.  This patient requires this care by a professional since this patient will be at risk due to having diabetes.  This patient is unable to cut nails himself since the patient cannot reach his nails.These nails are painful walking and wearing shoes.  This patient presents for at risk foot care today.  General Appearance  Alert, conversant and in no acute stress.  Vascular  Dorsalis pedis and posterior tibial  pulses are palpable  bilaterally.  Capillary return is within normal limits  bilaterally. Temperature is within normal limits  bilaterally.  Neurologic  Senn-Weinstein monofilament wire test within normal limits  bilaterally. Muscle power within normal limits bilaterally.  Nails Thick disfigured discolored nails with subungual debris  from hallux to fifth toes bilaterally. No evidence of bacterial infection or drainage bilaterally.  Orthopedic  No limitations of motion  feet .  No crepitus or effusions noted.  No bony pathology or digital deformities noted. Sub 2 pain right foot due to hallux limitus right foot.  Hallux limitus left foot.  Skin  normotropic skin with no porokeratosis noted bilaterally.  No signs of infections or ulcers noted.     Onychomycosis  Pain in right toes  Pain in left toes  Consent was obtained for treatment procedures.   Mechanical debridement of nails 1-5  bilaterally performed with a nail nipper.  Filed with dremel without incident.  Told him to get spenco insoles.   Return office visit   3 months                  Told patient to return for periodic foot care and evaluation due to potential at risk complications.   Gardiner Barefoot DPM

## 2020-06-20 ENCOUNTER — Ambulatory Visit: Payer: PRIVATE HEALTH INSURANCE | Admitting: Podiatry

## 2020-06-20 ENCOUNTER — Other Ambulatory Visit: Payer: Self-pay

## 2020-06-20 ENCOUNTER — Encounter: Payer: Self-pay | Admitting: Podiatry

## 2020-06-20 DIAGNOSIS — M79675 Pain in left toe(s): Secondary | ICD-10-CM | POA: Diagnosis not present

## 2020-06-20 DIAGNOSIS — E1140 Type 2 diabetes mellitus with diabetic neuropathy, unspecified: Secondary | ICD-10-CM

## 2020-06-20 DIAGNOSIS — B351 Tinea unguium: Secondary | ICD-10-CM | POA: Diagnosis not present

## 2020-06-20 DIAGNOSIS — E1149 Type 2 diabetes mellitus with other diabetic neurological complication: Secondary | ICD-10-CM | POA: Diagnosis not present

## 2020-06-20 DIAGNOSIS — M79674 Pain in right toe(s): Secondary | ICD-10-CM

## 2020-06-20 NOTE — Progress Notes (Signed)
This patient returns to my office for at risk foot care.  This patient requires this care by a professional since this patient will be at risk due to having diabetes.  This patient is unable to cut nails himself since the patient cannot reach his nails.These nails are painful walking and wearing shoes.  This patient presents for at risk foot care today.  General Appearance  Alert, conversant and in no acute stress.  Vascular  Dorsalis pedis and posterior tibial  pulses are palpable  bilaterally.  Capillary return is within normal limits  bilaterally. Temperature is within normal limits  bilaterally.  Neurologic  Senn-Weinstein monofilament wire test within normal limits  bilaterally. Muscle power within normal limits bilaterally.  Nails Thick disfigured discolored nails with subungual debris  from hallux to fifth toes bilaterally. No evidence of bacterial infection or drainage bilaterally.  Orthopedic  No limitations of motion  feet .  No crepitus or effusions noted.  No bony pathology or digital deformities noted. Hallux limitus.  Skin  normotropic skin with no porokeratosis noted bilaterally.  No signs of infections or ulcers noted.     Onychomycosis  Pain in right toes  Pain in left toes  Consent was obtained for treatment procedures.   Mechanical debridement of nails 1-5  bilaterally performed with a nail nipper.  Filed with dremel without incident.  Told him to get spenco insoles.   Return office visit   3 months                  Told patient to return for periodic foot care and evaluation due to potential at risk complications.   Gardiner Barefoot DPM

## 2020-09-29 DIAGNOSIS — I10 Essential (primary) hypertension: Secondary | ICD-10-CM | POA: Diagnosis not present

## 2020-09-29 DIAGNOSIS — E785 Hyperlipidemia, unspecified: Secondary | ICD-10-CM | POA: Diagnosis not present

## 2020-09-29 DIAGNOSIS — E118 Type 2 diabetes mellitus with unspecified complications: Secondary | ICD-10-CM | POA: Diagnosis not present

## 2020-09-29 DIAGNOSIS — Z6833 Body mass index (BMI) 33.0-33.9, adult: Secondary | ICD-10-CM | POA: Diagnosis not present

## 2020-10-03 ENCOUNTER — Ambulatory Visit: Payer: 59 | Admitting: Podiatry

## 2020-12-20 ENCOUNTER — Other Ambulatory Visit: Payer: Self-pay

## 2023-11-07 ENCOUNTER — Other Ambulatory Visit (HOSPITAL_COMMUNITY): Payer: Self-pay

## 2023-11-07 MED ORDER — MOUNJARO 12.5 MG/0.5ML ~~LOC~~ SOAJ
12.5000 mg | SUBCUTANEOUS | 3 refills | Status: DC
  Filled 2023-11-07 – 2023-11-11 (×2): qty 2, 28d supply, fill #0
  Filled 2023-12-08: qty 2, 28d supply, fill #1
  Filled 2023-12-30 – 2024-01-02 (×2): qty 2, 28d supply, fill #2
  Filled 2024-02-03 – 2024-02-06 (×2): qty 2, 28d supply, fill #3

## 2023-11-11 ENCOUNTER — Other Ambulatory Visit (HOSPITAL_COMMUNITY): Payer: Self-pay

## 2023-12-08 ENCOUNTER — Other Ambulatory Visit (HOSPITAL_COMMUNITY): Payer: Self-pay

## 2023-12-30 ENCOUNTER — Other Ambulatory Visit (HOSPITAL_COMMUNITY): Payer: Self-pay

## 2024-01-02 ENCOUNTER — Other Ambulatory Visit (HOSPITAL_COMMUNITY): Payer: Self-pay

## 2024-01-02 ENCOUNTER — Other Ambulatory Visit: Payer: Self-pay

## 2024-01-12 ENCOUNTER — Other Ambulatory Visit (HOSPITAL_COMMUNITY): Payer: Self-pay

## 2024-02-03 ENCOUNTER — Other Ambulatory Visit (HOSPITAL_COMMUNITY): Payer: Self-pay

## 2024-03-02 ENCOUNTER — Other Ambulatory Visit (HOSPITAL_COMMUNITY): Payer: Self-pay

## 2024-03-02 MED ORDER — MOUNJARO 12.5 MG/0.5ML ~~LOC~~ SOAJ
12.5000 mg | SUBCUTANEOUS | 3 refills | Status: AC
  Filled 2024-03-02 – 2024-03-26 (×2): qty 2, 28d supply, fill #0

## 2024-03-14 ENCOUNTER — Other Ambulatory Visit (HOSPITAL_COMMUNITY): Payer: Self-pay

## 2024-03-26 ENCOUNTER — Other Ambulatory Visit: Payer: Self-pay

## 2024-03-26 ENCOUNTER — Other Ambulatory Visit (HOSPITAL_COMMUNITY): Payer: Self-pay

## 2024-04-04 ENCOUNTER — Other Ambulatory Visit (HOSPITAL_COMMUNITY): Payer: Self-pay

## 2024-04-04 MED ORDER — MOUNJARO 15 MG/0.5ML ~~LOC~~ SOAJ
15.0000 mg | SUBCUTANEOUS | 3 refills | Status: DC
  Filled 2024-04-04: qty 2, 28d supply, fill #0
  Filled 2024-04-13: qty 2, 28d supply, fill #1
  Filled 2024-04-20 – 2024-04-23 (×2): qty 2, 28d supply, fill #0
  Filled 2024-05-14 – 2024-05-21 (×2): qty 2, 28d supply, fill #1
  Filled 2024-06-11 – 2024-06-15 (×3): qty 2, 28d supply, fill #2
  Filled 2024-07-09 – 2024-07-10 (×2): qty 2, 28d supply, fill #3

## 2024-04-13 ENCOUNTER — Other Ambulatory Visit (HOSPITAL_COMMUNITY): Payer: Self-pay

## 2024-04-16 ENCOUNTER — Other Ambulatory Visit (HOSPITAL_COMMUNITY): Payer: Self-pay

## 2024-04-20 ENCOUNTER — Other Ambulatory Visit (HOSPITAL_COMMUNITY): Payer: Self-pay

## 2024-04-23 ENCOUNTER — Other Ambulatory Visit (HOSPITAL_COMMUNITY): Payer: Self-pay

## 2024-04-24 ENCOUNTER — Other Ambulatory Visit (HOSPITAL_COMMUNITY): Payer: Self-pay

## 2024-05-14 ENCOUNTER — Other Ambulatory Visit (HOSPITAL_COMMUNITY): Payer: Self-pay

## 2024-05-21 ENCOUNTER — Other Ambulatory Visit (HOSPITAL_COMMUNITY): Payer: Self-pay

## 2024-05-21 ENCOUNTER — Other Ambulatory Visit: Payer: Self-pay

## 2024-06-11 ENCOUNTER — Other Ambulatory Visit (HOSPITAL_COMMUNITY): Payer: Self-pay

## 2024-06-14 ENCOUNTER — Other Ambulatory Visit (HOSPITAL_COMMUNITY): Payer: Self-pay

## 2024-07-09 ENCOUNTER — Other Ambulatory Visit (HOSPITAL_COMMUNITY): Payer: Self-pay

## 2024-07-13 ENCOUNTER — Other Ambulatory Visit (HOSPITAL_COMMUNITY): Payer: Self-pay

## 2024-08-06 ENCOUNTER — Other Ambulatory Visit (HOSPITAL_COMMUNITY): Payer: Self-pay

## 2024-08-09 ENCOUNTER — Other Ambulatory Visit (HOSPITAL_COMMUNITY): Payer: Self-pay

## 2024-08-09 MED ORDER — MOUNJARO 15 MG/0.5ML ~~LOC~~ SOAJ
15.0000 mg | SUBCUTANEOUS | 3 refills | Status: DC
  Filled 2024-08-09 – 2024-08-21 (×2): qty 2, 28d supply, fill #0
  Filled 2024-09-13 – 2024-09-17 (×4): qty 2, 28d supply, fill #1
  Filled 2024-10-11 – 2024-10-12 (×2): qty 2, 28d supply, fill #2
  Filled 2024-11-08: qty 2, 28d supply, fill #3

## 2024-08-20 ENCOUNTER — Other Ambulatory Visit (HOSPITAL_COMMUNITY): Payer: Self-pay

## 2024-08-21 ENCOUNTER — Other Ambulatory Visit (HOSPITAL_COMMUNITY): Payer: Self-pay

## 2024-09-13 ENCOUNTER — Other Ambulatory Visit (HOSPITAL_COMMUNITY): Payer: Self-pay

## 2024-09-17 ENCOUNTER — Other Ambulatory Visit (HOSPITAL_COMMUNITY): Payer: Self-pay

## 2024-09-19 ENCOUNTER — Other Ambulatory Visit (HOSPITAL_COMMUNITY): Payer: Self-pay

## 2024-10-11 ENCOUNTER — Other Ambulatory Visit (HOSPITAL_COMMUNITY): Payer: Self-pay

## 2024-12-04 ENCOUNTER — Other Ambulatory Visit (HOSPITAL_COMMUNITY): Payer: Self-pay

## 2024-12-04 MED ORDER — MOUNJARO 15 MG/0.5ML ~~LOC~~ SOAJ
15.0000 mg | SUBCUTANEOUS | 1 refills | Status: AC
  Filled 2024-12-04: qty 2, 28d supply, fill #0
  Filled 2025-01-04: qty 2, 28d supply, fill #1

## 2025-01-04 ENCOUNTER — Other Ambulatory Visit (HOSPITAL_COMMUNITY): Payer: Self-pay
